# Patient Record
Sex: Male | Born: 1960 | Race: White | Hispanic: No | Marital: Married | Smoking: Current some day smoker
Health system: Southern US, Community
[De-identification: ages and names within clinical notes are randomized; demographics above are authoritative.]

## PROBLEM LIST (undated history)

## (undated) DIAGNOSIS — R220 Localized swelling, mass and lump, head: Secondary | ICD-10-CM

## (undated) DIAGNOSIS — K219 Gastro-esophageal reflux disease without esophagitis: Secondary | ICD-10-CM

## (undated) DIAGNOSIS — R221 Localized swelling, mass and lump, neck: Secondary | ICD-10-CM

## (undated) DIAGNOSIS — I10 Essential (primary) hypertension: Secondary | ICD-10-CM

## (undated) DIAGNOSIS — M1710 Unilateral primary osteoarthritis, unspecified knee: Secondary | ICD-10-CM

## (undated) DIAGNOSIS — E785 Hyperlipidemia, unspecified: Secondary | ICD-10-CM

## (undated) DIAGNOSIS — Z87891 Personal history of nicotine dependence: Secondary | ICD-10-CM

## (undated) DIAGNOSIS — J302 Other seasonal allergic rhinitis: Secondary | ICD-10-CM

## (undated) DIAGNOSIS — J301 Allergic rhinitis due to pollen: Secondary | ICD-10-CM

## (undated) DIAGNOSIS — M25559 Pain in unspecified hip: Secondary | ICD-10-CM

## (undated) HISTORY — DX: Hyperlipidemia, unspecified: E78.5

## (undated) HISTORY — DX: Gastro-esophageal reflux disease without esophagitis: K21.9

## (undated) HISTORY — DX: Unilateral primary osteoarthritis, unspecified knee: M17.10

## (undated) HISTORY — DX: Essential (primary) hypertension: I10

## (undated) HISTORY — DX: Pain in unspecified hip: M25.559

## (undated) HISTORY — DX: Personal history of nicotine dependence: Z87.891

## (undated) HISTORY — DX: Localized swelling, mass and lump, head: R22.0

## (undated) HISTORY — DX: Localized swelling, mass and lump, neck: R22.1

---
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
## 2007-05-26 HISTORY — PX: KNEE SURGERY: SHX244

## 2008-09-12 ENCOUNTER — Encounter: Payer: Self-pay | Admitting: Family Medicine

## 2009-06-26 ENCOUNTER — Encounter: Admission: RE | Admit: 2009-06-26 | Discharge: 2009-07-15 | Payer: Self-pay | Admitting: Orthopedic Surgery

## 2009-07-10 ENCOUNTER — Ambulatory Visit: Payer: Self-pay | Admitting: Family Medicine

## 2009-07-10 DIAGNOSIS — K219 Gastro-esophageal reflux disease without esophagitis: Secondary | ICD-10-CM

## 2009-07-10 DIAGNOSIS — E785 Hyperlipidemia, unspecified: Secondary | ICD-10-CM

## 2009-07-10 DIAGNOSIS — I10 Essential (primary) hypertension: Secondary | ICD-10-CM

## 2009-07-10 DIAGNOSIS — M1710 Unilateral primary osteoarthritis, unspecified knee: Secondary | ICD-10-CM

## 2009-07-10 DIAGNOSIS — Z87891 Personal history of nicotine dependence: Secondary | ICD-10-CM

## 2009-07-10 DIAGNOSIS — IMO0002 Reserved for concepts with insufficient information to code with codable children: Secondary | ICD-10-CM

## 2009-07-10 HISTORY — DX: Gastro-esophageal reflux disease without esophagitis: K21.9

## 2009-07-10 HISTORY — DX: Hyperlipidemia, unspecified: E78.5

## 2009-07-10 HISTORY — DX: Personal history of nicotine dependence: Z87.891

## 2009-07-10 HISTORY — DX: Reserved for concepts with insufficient information to code with codable children: IMO0002

## 2009-07-10 HISTORY — DX: Essential (primary) hypertension: I10

## 2009-09-10 ENCOUNTER — Ambulatory Visit: Payer: Self-pay | Admitting: Family Medicine

## 2009-09-10 DIAGNOSIS — M25559 Pain in unspecified hip: Secondary | ICD-10-CM

## 2009-09-10 HISTORY — DX: Pain in unspecified hip: M25.559

## 2009-09-18 ENCOUNTER — Telehealth: Payer: Self-pay | Admitting: Family Medicine

## 2009-10-17 ENCOUNTER — Ambulatory Visit: Payer: Self-pay | Admitting: Family Medicine

## 2009-11-05 ENCOUNTER — Telehealth: Payer: Self-pay | Admitting: Family Medicine

## 2010-02-13 ENCOUNTER — Ambulatory Visit: Payer: Self-pay | Admitting: Family Medicine

## 2010-05-07 ENCOUNTER — Ambulatory Visit: Payer: Self-pay | Admitting: Family Medicine

## 2010-05-22 ENCOUNTER — Ambulatory Visit: Payer: Self-pay | Admitting: Family Medicine

## 2010-05-22 ENCOUNTER — Encounter: Payer: Self-pay | Admitting: Family Medicine

## 2010-05-29 ENCOUNTER — Ambulatory Visit
Admission: RE | Admit: 2010-05-29 | Discharge: 2010-05-29 | Payer: Self-pay | Source: Home / Self Care | Attending: Family Medicine | Admitting: Family Medicine

## 2010-05-29 ENCOUNTER — Encounter: Payer: Self-pay | Admitting: Family Medicine

## 2010-05-29 ENCOUNTER — Ambulatory Visit: Admit: 2010-05-29 | Payer: Self-pay | Admitting: Family Medicine

## 2010-05-29 DIAGNOSIS — R220 Localized swelling, mass and lump, head: Secondary | ICD-10-CM | POA: Insufficient documentation

## 2010-05-29 DIAGNOSIS — R221 Localized swelling, mass and lump, neck: Secondary | ICD-10-CM

## 2010-05-29 HISTORY — DX: Localized swelling, mass and lump, head: R22.0

## 2010-05-30 ENCOUNTER — Encounter (INDEPENDENT_AMBULATORY_CARE_PROVIDER_SITE_OTHER): Payer: Self-pay | Admitting: *Deleted

## 2010-06-03 ENCOUNTER — Other Ambulatory Visit: Payer: Self-pay | Admitting: Family Medicine

## 2010-06-03 ENCOUNTER — Ambulatory Visit
Admission: RE | Admit: 2010-06-03 | Discharge: 2010-06-03 | Payer: Self-pay | Source: Home / Self Care | Attending: Family Medicine | Admitting: Family Medicine

## 2010-06-03 LAB — BASIC METABOLIC PANEL
BUN: 21 mg/dL (ref 6–23)
CO2: 30 mEq/L (ref 19–32)
Calcium: 9.2 mg/dL (ref 8.4–10.5)
Chloride: 105 mEq/L (ref 96–112)
Creatinine, Ser: 0.6 mg/dL (ref 0.4–1.5)
GFR: 145.74 mL/min (ref >60.00)
Glucose, Bld: 95 mg/dL (ref 70–99)
Potassium: 4.3 mEq/L (ref 3.5–5.1)
Sodium: 141 mEq/L (ref 135–145)

## 2010-06-03 LAB — HEPATIC FUNCTION PANEL
ALT: 33 U/L (ref 0–53)
AST: 23 U/L (ref 0–37)
Albumin: 4.1 g/dL (ref 3.5–5.2)
Alkaline Phosphatase: 73 U/L (ref 39–117)
Bilirubin, Direct: 0.1 mg/dL (ref 0.0–0.3)
Total Bilirubin: 0.8 mg/dL (ref 0.3–1.2)
Total Protein: 7 g/dL (ref 6.0–8.3)

## 2010-06-03 LAB — LIPID PANEL
Cholesterol: 193 mg/dL (ref 0–200)
HDL: 51 mg/dL (ref >39.00)
LDL Cholesterol: 134 mg/dL — ABNORMAL HIGH (ref 0–99)
Total CHOL/HDL Ratio: 4
Triglycerides: 38 mg/dL (ref 0.0–149.0)
VLDL: 7.6 mg/dL (ref 0.0–40.0)

## 2010-06-03 LAB — PSA: PSA: 0.29 ng/mL (ref 0.10–4.00)

## 2010-06-03 LAB — TSH: TSH: 1.49 u[IU]/mL (ref 0.35–5.50)

## 2010-06-10 ENCOUNTER — Ambulatory Visit: Payer: Self-pay | Admitting: Cardiology

## 2010-06-23 ENCOUNTER — Encounter (INDEPENDENT_AMBULATORY_CARE_PROVIDER_SITE_OTHER): Payer: Self-pay

## 2010-06-24 ENCOUNTER — Ambulatory Visit
Admission: RE | Admit: 2010-06-24 | Discharge: 2010-06-24 | Payer: Self-pay | Source: Home / Self Care | Attending: Gastroenterology | Admitting: Gastroenterology

## 2010-06-24 NOTE — Assessment & Plan Note (Signed)
Summary: MED CK // RS   Vital Signs:  Patient profile:   50 year old male Temp:     98.0 degrees F oral BP sitting:   138 / 78  (left arm) Cuff size:   regular  Vitals Entered By: Sid Falcon LPN (Oct 17, 2009 4:23 PM) CC: Med check, knee and hip pain   History of Present Illness: Patient here for the following:  Bilateral hip and knee pains. Early morning stiffness. Pain is moderate. On ibuprofen 800 mg twice daily still has considerable pain at times. Recent x-rays revealed degenerative changes. No injury.  Hypertension treated with lisinopril. Not monitoring at home. No recent headaches, chest pains, dizziness, or palpitations.  Allergies (verified): No Known Drug Allergies  Past History:  Past Medical History: Last updated: 07/10/2009 Arthritis (Osteo) Hyperlipidemia Hypertension Kidney stones  Past Surgical History: Last updated: 07/10/2009 Partial right knee surgery 2009 Eye surgery, born cross eyed) 1965  Review of Systems      See HPI  Physical Exam  General:  Well-developed,well-nourished,in no acute distress; alert,appropriate and cooperative throughout examination Mouth:  Oral mucosa and oropharynx without lesions or exudates.  Teeth in good repair. Neck:  No deformities, masses, or tenderness noted. Lungs:  Normal respiratory effort, chest expands symmetrically. Lungs are clear to auscultation, no crackles or wheezes. Heart:  Normal rate and regular rhythm. S1 and S2 normal without gallop, murmur, click, rub or other extra sounds. Extremities:  full range of motion both knees and fairly good range of motion both hips. No extremity edema. Neurologic:  strength normal in all extremities.     Impression & Recommendations:  Problem # 1:  OSTEOARTHRITIS, KNEES, BILATERAL (ICD-715.96) discussed other options. Try addition of tramadol 50 mg to his ibuprofen. Discussed exercise. His updated medication list for this problem includes:    Flexeril 10 Mg  Tabs (Cyclobenzaprine hcl) ..... Once daily    Ibuprofen 800 Mg Tabs (Ibuprofen) .Marland Kitchen... Three times a day    Tramadol Hcl 50 Mg Tabs (Tramadol hcl) .Marland Kitchen... 1-2 by mouth q 6 hours as needed pain  Problem # 2:  HYPERTENSION (ICD-401.9)  His updated medication list for this problem includes:    Lisinopril 10 Mg Tabs (Lisinopril) ..... Once daily  Complete Medication List: 1)  Flexeril 10 Mg Tabs (Cyclobenzaprine hcl) .... Once daily 2)  Lisinopril 10 Mg Tabs (Lisinopril) .... Once daily 3)  Ibuprofen 800 Mg Tabs (Ibuprofen) .... Three times a day 4)  Tramadol Hcl 50 Mg Tabs (Tramadol hcl) .Marland Kitchen.. 1-2 by mouth q 6 hours as needed pain  Patient Instructions: 1)  It is important that you exercise reguarly at least 20 minutes 5 times a week. If you develop chest pain, have severe difficulty breathing, or feel very tired, stop exercising immediately and seek medical attention.  2)  You need to lose weight. Consider a lower calorie diet and regular exercise.  3)  Please schedule a follow-up appointment in 4 months .  Prescriptions: TRAMADOL HCL 50 MG TABS (TRAMADOL HCL) 1-2 by mouth q 6 hours as needed pain  #120 x 3   Entered and Authorized by:   Evelena Peat MD   Signed by:   Evelena Peat MD on 10/17/2009   Method used:   Electronically to        [facility] (retail)       [address]       [facility] [place], [place]  [address]       Ph: [phone] or [phone]       Fax: [phone]   RxID:   [id number]

## 2010-06-24 NOTE — Letter (Signed)
Summary: Records from [facility] in [place] -2010  Records from [facility] in [place] -2010   Imported By: Maryln Gottron 12/05/2009 12:37:52  _____________________________________________________________________  External Attachment:    Type:   Image     Comment:   External Document

## 2010-06-24 NOTE — Assessment & Plan Note (Signed)
Summary: MUSCLE PAIN // RS   Vital Signs:  Patient profile:   50 year old male Weight:      248 pounds Temp:     98.4 degrees F oral BP sitting:   142 / 98  (left arm) Cuff size:   large  Vitals Entered By: Sid Falcon LPN (September 10, 2009 10:15 AM) CC: muscle and body aches Pain Assessment Patient in pain? yes     Location: hip Intensity: 5 Type: sharp Onset of pain  Intermittent   History of Present Illness: Patient seen with bilateral hip pain. Pain radiates mostly groin region. Duration 3 of 4 months. Pain described as sharp and occasionally achy crampy and intermittent. Exacerbated by hip flexion using ibuprofen without improvement. Vicodin helps. Graded 6/10 at its worst. Pain seems to be slightly worse first thing in the morning. Denies any low back pain does have some right buttock pain intermittently. No incontinence. No numbness or weakness. no injury. Works at [employer] and does lots of lifting.  Allergies: No Known Drug Allergies  Past History:  Past Medical History: Last updated: 07/10/2009 Arthritis (Osteo) Hyperlipidemia Hypertension Kidney stones  Past Surgical History: Last updated: 07/10/2009 Partial right knee surgery 2009 Eye surgery, born cross eyed) [birth year] PMH reviewed for relevance  Review of Systems  The patient denies anorexia, fever, weight loss, and difficulty walking.    Physical Exam  General:  Well-developed,well-nourished,in no acute distress; alert,appropriate and cooperative throughout examination Lungs:  Normal respiratory effort, chest expands symmetrically. Lungs are clear to auscultation, no crackles or wheezes. Heart:  Normal rate and regular rhythm. S1 and S2 normal without gallop, murmur, click, rub or other extra sounds. Extremities:  good range of motion right and left hip. Minimal pain with extreme internal rotation. No edema lower extremities. Normal pulses. Straight leg raise is negative  bilaterally Neurologic:  full strength lower extremities with plantar flexion, dorsiflexion, and knee extension. Normal sensory function   Impression & Recommendations:  Problem # 1:  HIP PAIN, BILATERAL (ICD-719.45)  ?iliopsoas tendonitis, ?osteoarthritis.  given duration obtained plain films to further assess. Continue ibuprofen. His updated medication list for this problem includes:    Flexeril 10 Mg Tabs (Cyclobenzaprine hcl) ..... Once daily    Ibuprofen 800 Mg Tabs (Ibuprofen) .Marland Kitchen... Three times a day  Orders: T-Hip Comp Left Min 2-views (73510TC) T-Hip Comp Right Min 2 views (73510TC)  Complete Medication List: 1)  Flexeril 10 Mg Tabs (Cyclobenzaprine hcl) .... Once daily 2)  Lisinopril 10 Mg Tabs (Lisinopril) .... Once daily 3)  Ibuprofen 800 Mg Tabs (Ibuprofen) .... Three times a day  Patient Instructions: 1)  Obtain hip x-rays. 2)  Will call with results. 3)  Consider physical therapy if x-rays unrevealing.

## 2010-06-24 NOTE — Assessment & Plan Note (Signed)
Summary: 4 month rov/njr   Vital Signs:  Patient profile:   50 year old male Weight:      231 pounds Temp:     98.1 degrees F oral BP sitting:   138 / 100  (left arm) Cuff size:   regular  Vitals Entered By: Sid Falcon LPN (February 13, 2010 4:30 PM)  Serial Vital Signs/Assessments:  Time      Position  BP       Pulse  Resp  Temp     By                     146/94                         Evelena Peat MD  CC: 4 month follow-up, Hypertension Management   History of Present Illness: Patient here for medical followup. Hypertension treated with lisinopril 10 mg daily. Blood pressure control marginal.  Knee pain improved with tramadol.  Hypertension History:      He denies headache, chest pain, palpitations, dyspnea with exertion, orthopnea, PND, peripheral edema, visual symptoms, neurologic problems, syncope, and side effects from treatment.  He notes no problems with any antihypertensive medication side effects.        Positive major cardiovascular risk factors include male age 21 years old or older, hyperlipidemia, hypertension, and current tobacco user.     Allergies (verified): No Known Drug Allergies  Past History:  Past Medical History: Last updated: 07/10/2009 Arthritis (Osteo) Hyperlipidemia Hypertension Kidney stones PMH reviewed for relevance  Review of Systems  The patient denies chest pain, syncope, dyspnea on exertion, peripheral edema, headaches, and abdominal pain.    Physical Exam  General:  Well-developed,well-nourished,in no acute distress; alert,appropriate and cooperative throughout examination Mouth:  Oral mucosa and oropharynx without lesions or exudates.  Teeth in good repair. Neck:  No deformities, masses, or tenderness noted. Lungs:  Normal respiratory effort, chest expands symmetrically. Lungs are clear to auscultation, no crackles or wheezes. Heart:  Normal rate and regular rhythm. S1 and S2 normal without gallop, murmur, click, rub or  other extra sounds. Extremities:  No clubbing, cyanosis, edema, or deformity noted with normal full range of motion of all joints.     Impression & Recommendations:  Problem # 1:  HYPERTENSION (ICD-401.9) Assessment Deteriorated titrate lisinopril 20 mg daily His updated medication list for this problem includes:    Lisinopril 20 Mg Tabs (Lisinopril) ..... One by mouth once daily  Problem # 2:  OSTEOARTHRITIS, KNEES, BILATERAL (ICD-715.96) Assessment: Improved  His updated medication list for this problem includes:    Flexeril 10 Mg Tabs (Cyclobenzaprine hcl) ..... Once daily as needed    Ibuprofen 800 Mg Tabs (Ibuprofen) .Marland Kitchen... Three times a day    Tramadol Hcl 50 Mg Tabs (Tramadol hcl) .Marland Kitchen... 1-2 by mouth q 6 hours as needed pain  Problem # 3:  Preventive Health Care (ICD-V70.0) flu vaccine given  Complete Medication List: 1)  Flexeril 10 Mg Tabs (Cyclobenzaprine hcl) .... Once daily as needed 2)  Lisinopril 20 Mg Tabs (Lisinopril) .... One by mouth once daily 3)  Ibuprofen 800 Mg Tabs (Ibuprofen) .... Three times a day 4)  Tramadol Hcl 50 Mg Tabs (Tramadol hcl) .Marland Kitchen.. 1-2 by mouth q 6 hours as needed pain  Other Orders: Admin 1st Vaccine (60454) Flu Vaccine 11yrs + (09811)  Hypertension Assessment/Plan:      The patient's hypertensive risk group is category B: At  least one risk factor (excluding diabetes) with no target organ damage.  Today's blood pressure is 138/100.    Patient Instructions: 1)  Check your  Blood Pressure regularly . If it is above:140/90   you should make an appointment. 2)  increase lisinopril 20 mg daily 3)  Please schedule a follow-up appointment in 3 months .  Prescriptions: LISINOPRIL 20 MG TABS (LISINOPRIL) one by mouth once daily  #90 x 3   Entered and Authorized by:   Evelena Peat MD   Signed by:   Evelena Peat MD on 02/13/2010   Method used:   Electronically to        [facility] (retail)       [address].       [place], [place]  [address]       Ph: [phone] or [phone]       Fax: [phone]   RxID:   [id number]   Flu Vaccine Consent Questions     Do you have a history of severe allergic reactions to this vaccine? no    Any prior history of allergic reactions to egg and/or gelatin? no    Do you have a sensitivity to the preservative Thimersol? no    Do you have a past history of Guillan-Barre Syndrome? no    Do you currently have an acute febrile illness? no    Have you ever had a severe reaction to latex? no    Vaccine information given and explained to patient? yes    Are you currently pregnant? no    Lot Number:[id number]   Exp Date:11/22/2010   Site Given  Left Deltoid IMbflu

## 2010-06-24 NOTE — Assessment & Plan Note (Signed)
Summary: NEW PT OV//CCM   Vital Signs:  Patient profile:   50 year old male Height:      66. inches Weight:      240 pounds BMI:     38.88 Temp:     99.2 degrees F oral Pulse rate:   80 / minute Pulse rhythm:   regular Resp:     12 per minute BP sitting:   140 / 90  (left arm) Cuff size:   regular  Vitals Entered By: Sid Falcon LPN (July 10, 2009 10:57 AM)  Nutrition Counseling: Patient's BMI is greater than 25 and therefore counseled on weight management options. CC: New to establish, new to area from [place], Hypertension Management   History of Present Illness:  new patient to establish care.  past medical history reviewed. He has history of osteoarthritis with previous right partial knee replacement 2009. Hypertension treated with lisinopril 10 mg daily. History of mild elevated lipids but never treated. Prior history of one solitary kidney stone.  Patient takes over-the-counter ibuprofen intermittently. Lisinopril 10 mg daily. Flexeril 10 mg as needed.  occasional GERD symptoms usually relieved with over-the-counter medications Family history significant for father hypertension, mother with history breast cancer.  Patient has couple children from previous marriage and couple step children. Smokes one pack cigarettes per day. Occasional alcohol use. Works as a Doctor, hospital. No regular exercise   Hypertension History:      He denies headache, chest pain, palpitations, dyspnea with exertion, orthopnea, PND, peripheral edema, visual symptoms, neurologic problems, syncope, and side effects from treatment.        Positive major cardiovascular risk factors include male age 51 years old or older, hyperlipidemia, hypertension, and current tobacco user.     Preventive Screening-Counseling & Management  Alcohol-Tobacco     Smoking Status: current     Packs/Day: 1.0     Year Started: 1977  Allergies (verified): No Known Drug Allergies  Past History:  Social  History: Last updated: 07/10/2009 Occupation:  Postal service Married Current Smoker, one PPD Alcohol use-yes  Risk Factors: Smoking Status: current (07/10/2009) Packs/Day: 1.0 (07/10/2009)  Past Medical History: Arthritis (Osteo) Hyperlipidemia Hypertension Kidney stones  Past Surgical History: Partial right knee surgery 2009 Eye surgery, born cross eyed) 59  Family History: Family History Hypertension  Social History: Occupation:  Research officer, political party Married Current Smoker, one PPD Alcohol use-yes Smoking Status:  current Packs/Day:  1.0 Occupation:  employed  Review of Systems       The patient complains of weight gain.  The patient denies anorexia, fever, weight loss, vision loss, decreased hearing, hoarseness, chest pain, syncope, dyspnea on exertion, peripheral edema, prolonged cough, headaches, hemoptysis, abdominal pain, melena, hematochezia, severe indigestion/heartburn, hematuria, incontinence, and muscle weakness.    Physical Exam  General:  patient is alert pleasant obese in no distress Head:  Normocephalic and atraumatic without obvious abnormalities. No apparent alopecia or balding. Eyes:  No corneal or conjunctival inflammation noted. EOMI. Perrla. Funduscopic exam benign, without hemorrhages, exudates or papilledema. Vision grossly normal. Mouth:  Oral mucosa and oropharynx without lesions or exudates.  Teeth in good repair. Neck:  No deformities, masses, or tenderness noted. Lungs:  Normal respiratory effort, chest expands symmetrically. Lungs are clear to auscultation, no crackles or wheezes. Heart:  normal rate, regular rhythm, and no murmur.   Extremities:  no pitting edema Neurologic:  alert & oriented X3, cranial nerves II-XII intact, and strength normal in all extremities.   Psych:  normally interactive, good eye  contact, not anxious appearing, and not depressed appearing.     Impression & Recommendations:  Problem # 1:  HYPERTENSION  (ICD-401.9) borderline control.  Work on weight loss and f/u within 6 months. His updated medication list for this problem includes:    Lisinopril 10 Mg Tabs (Lisinopril) ..... Once daily  Problem # 2:  HYPERLIPIDEMIA (ICD-272.4)  Problem # 3:  GERD (ICD-530.81)  Problem # 4:  OSTEOARTHRITIS, KNEES, BILATERAL (ICD-715.96)  His updated medication list for this problem includes:    Flexeril 10 Mg Tabs (Cyclobenzaprine hcl) ..... Once daily    Ibuprofen 800 Mg Tabs (Ibuprofen) .Marland Kitchen... Three times a day  Problem # 5:  PERS HX TOBACCO USE PRESENTING HAZARDS HEALTH (ICD-V15.82) discussed smoking cessation.  Complete Medication List: 1)  Flexeril 10 Mg Tabs (Cyclobenzaprine hcl) .... Once daily 2)  Lisinopril 10 Mg Tabs (Lisinopril) .... Once daily 3)  Ibuprofen 800 Mg Tabs (Ibuprofen) .... Three times a day  Hypertension Assessment/Plan:      The patient's hypertensive risk group is category B: At least one risk factor (excluding diabetes) with no target organ damage.  Today's blood pressure is 140/90.    Patient Instructions: 1)  Consider scheduling complete physical examination by next fall 2)  Stop smoking tips: Choose a quit date. Cut down before the quit date. Decide what you will do as a substitute when you feel the urge to smoke(gum, toothpick, exercise).  3)  It is important that you exercise reguarly at least 20 minutes 5 times a week. If you develop chest pain, have severe difficulty breathing, or feel very tired, stop exercising immediately and seek medical attention.  4)  You need to lose weight. Consider a lower calorie diet and regular exercise.   Preventive Care Screening  Last Tetanus Booster:    Date:  05/26/2007    Results:  Historical

## 2010-06-24 NOTE — Progress Notes (Signed)
Summary: Tramadol working  Phone Note Call from Patient   Caller: Patient Call For: Evelena Peat MD Summary of Call: Pt wants Dr. Caryl Never to know that the Tramadol is working great. [phone] Initial call taken by: Lynann Beaver CMA,  November 05, 2009 3:23 PM  Follow-up for Phone Call        noted Follow-up by: Evelena Peat MD,  November 05, 2009 6:55 PM

## 2010-06-24 NOTE — Progress Notes (Signed)
Summary: Pt returning call from Dr. Caryl Never  Phone Note Call from Patient Call back at Home Phone [phone]   Caller: Patient Summary of Call: Pt said that he is returning Dr. Mar Daring call. Please call back asap.  Initial call taken by: Lucy Antigua,  September 18, 2009 8:25 AM  Follow-up for Phone Call        pt notified of x-ray results.  Moderate degenerative changes hips.  At this point he does not wish to see orthopedist.  Consider PT and patient will get back if interested. Follow-up by: Evelena Peat MD,  September 18, 2009 1:31 PM

## 2010-06-26 LAB — CONVERTED CEMR LAB
Bilirubin Urine: NEGATIVE
Blood in Urine, dipstick: NEGATIVE
Glucose, Urine, Semiquant: NEGATIVE
Ketones, urine, test strip: NEGATIVE
Nitrite: NEGATIVE
Protein, U semiquant: NEGATIVE
Specific Gravity, Urine: 1.025
Urobilinogen, UA: 0.2
WBC Urine, dipstick: NEGATIVE
pH: 5

## 2010-06-26 NOTE — Letter (Signed)
Summary: Out of Work  Adult nurse at [facility]  [address]   [place], [place] [address]   Phone: [phone]  Fax: [phone]    May 29, 2010   Employee:  SAED HUDLOW    To Whom It May Concern:   For Medical reasons, please excuse the above named employee from work for the following dates:  Start:   05/29/2010  End:   05/29/2010  If you need additional information, please feel free to contact our office.         Sincerely,         Evelena Peat, MD

## 2010-06-26 NOTE — Letter (Signed)
Summary: Pre Visit Letter Revised  [facility]  [address] [place], [place] [address]   Phone: [phone]  Fax: [phone]        05/30/2010 MRN: [id number] [facility] [address] [place], [place]  [address]             Procedure Date:  07-08-10   Welcome to the Gastroenterology Division at [facility].    You are scheduled to see a nurse for your pre-procedure visit on 06-24-10 at 1:00p.m. on the 3rd floor at [facility], [address]  We ask that you try to arrive at our office 15 minutes prior to your appointment time to allow for check-in.  Please take a minute to review the attached form.  If you answer "Yes" to one or more of the questions on the first page, we ask that you call the person listed at your earliest opportunity.  If you answer "No" to all of the questions, please complete the rest of the form and bring it to your appointment.    Your nurse visit will consist of discussing your medical and surgical history, your immediate family medical history, and your medications.   If you are unable to list all of your medications on the form, please bring the medication bottles to your appointment and we will list them.  We will need to be aware of both prescribed and over the counter drugs.  We will need to know exact dosage information as well.    Please be prepared to read and sign documents such as consent forms, a financial agreement, and acknowledgement forms.  If necessary, and with your consent, a friend or relative is welcome to sit-in on the nurse visit with you.  Please bring your insurance card so that we may make a copy of it.  If your insurance requires a referral to see a specialist, please bring your referral form from your primary care physician.  No co-pay is required for this nurse visit.     If you cannot keep your appointment, please call [phone] to cancel or reschedule prior to your appointment date.  This  allows [place] the opportunity to schedule an appointment for another patient in need of care.    Thank you for choosing [facility] for your medical needs.  We appreciate the opportunity to care for you.  Please visit [place] at our website  to learn more about our practice.  Sincerely, The Gastroenterology Division

## 2010-06-26 NOTE — Letter (Signed)
Summary: Out of Work  [facility] at [facility]  [address]   [place], [place] [address]   Phone: [phone]  Fax: [phone]    May 22, 2010   Employee:  Luis Ryan    To Whom It May Concern:   For Medical reasons, please excuse the above named employee from work for the following dates:  Start:   05/22/2010  End:   05/22/2010  If you need additional information, please feel free to contact our office.         Sincerely,       Evelena Peat, MD

## 2010-06-26 NOTE — Assessment & Plan Note (Signed)
Summary: cpx//ccm   Vital Signs:  Patient profile:   50 year old male Height:      66 inches Weight:      225 pounds BMI:     36.45 Temp:     98.7 degrees F oral Pulse rate:   12 / minute Pulse rhythm:   regular Resp:     12 per minute BP sitting:   120 / 82  (left arm) Cuff size:   large  Vitals Entered By: Sid Falcon LPN (May 29, 2010 10:42 AM)  History of Present Illness: Here for CPE.  Trying to quit smoking. Has lost some weight due to his efforts.  No hx colonoscopy.  Labs drawn but only CBC came back.  Per insurance, they were sent to [facility].  Pt has noted for about one year some R sided neck swelling which is a new problem.  No dysphagia.  Good appetite.  Swelling does not fluctuate over time.  No sore throat.  hx osteoarthritis, esp knees.  Early AM stiffness.  Tramadol helps and needs refills.  Hypertension History:      He denies headache, chest pain, palpitations, dyspnea with exertion, orthopnea, PND, peripheral edema, visual symptoms, neurologic problems, syncope, and side effects from treatment.        Positive major cardiovascular risk factors include male age 26 years old or older, hyperlipidemia, hypertension, and current tobacco user.     Clinical Review Panels:  Immunizations   Last Tetanus Booster:  Historical (05/26/2007)   Last Flu Vaccine:  Fluvax 3+ (02/13/2010)   Allergies (verified): No Known Drug Allergies  Past History:  Past Medical History: Last updated: 07/10/2009 Arthritis (Osteo) Hyperlipidemia Hypertension Kidney stones  Past Surgical History: Last updated: 07/10/2009 Partial right knee surgery 2009 Eye surgery, born cross eyed) 1965  Family History: Last updated: 07/10/2009 Family History Hypertension  Social History: Last updated: 07/10/2009 Occupation:  Postal service Married Current Smoker, one PPD Alcohol use-yes  Risk Factors: Smoking Status: current (07/10/2009) Packs/Day: 1.0  (07/10/2009) PMH-FH-SH reviewed for relevance  Review of Systems  The patient denies anorexia, fever, weight loss, weight gain, vision loss, decreased hearing, hoarseness, chest pain, syncope, dyspnea on exertion, peripheral edema, prolonged cough, headaches, hemoptysis, abdominal pain, melena, hematochezia, severe indigestion/heartburn, hematuria, incontinence, genital sores, muscle weakness, suspicious skin lesions, transient blindness, difficulty walking, depression, unusual weight change, abnormal bleeding, enlarged lymph nodes, and testicular masses.    Physical Exam  General:  Well-developed,well-nourished,in no acute distress; alert,appropriate and cooperative throughout examination Head:  Normocephalic and atraumatic without obvious abnormalities. No apparent alopecia or balding. Eyes:  No corneal or conjunctival inflammation noted. EOMI. Perrla. Funduscopic exam benign, without hemorrhages, exudates or papilledema. Vision grossly normal. Ears:  External ear exam shows no significant lesions or deformities.  Otoscopic examination reveals clear canals, tympanic membranes are intact bilaterally without bulging, retraction, inflammation or discharge. Hearing is grossly normal bilaterally. Mouth:  Oral mucosa and oropharynx without lesions or exudates.  Teeth in good repair. Neck:  pt has ?fatty mass several cms diameter below R mandible and also some swelling and ?enlargement R  submandibular region.  No adenopathy. Lungs:  Normal respiratory effort, chest expands symmetrically. Lungs are clear to auscultation, no crackles or wheezes. Heart:  Normal rate and regular rhythm. S1 and S2 normal without gallop, murmur, click, rub or other extra sounds. Abdomen:  Bowel sounds positive,abdomen soft and non-tender without masses, organomegaly or hernias noted. Rectal:  No external abnormalities noted. Normal sphincter tone. No rectal  masses or tenderness. Prostate:  Prostate gland firm and smooth, no  enlargement, nodularity, tenderness, mass, asymmetry or induration. Msk:  No deformity or scoliosis noted of thoracic or lumbar spine.   Extremities:  No clubbing, cyanosis, edema, or deformity noted with normal full range of motion of all joints.   Neurologic:  No cranial nerve deficits noted. Station and gait are normal. Plantar reflexes are down-going bilaterally. DTRs are symmetrical throughout. Sensory, motor and coordinative functions appear intact. Skin:  no rashes and no suspicious lesions.   Cervical Nodes:  No lymphadenopathy noted Psych:  normally interactive, good eye contact, not anxious appearing, and not depressed appearing.     Impression & Recommendations:  Problem # 1:  Preventive Health Care (ICD-V70.0) needs to quit smoking.  We are looking at redrawing his labs. Needs to lose some weight.  Baseline EKG looks OK. Set up colonoscopy.  Problem # 2:  NECK MASS (ICD-784.2) Assessment: New ?lipoma.  this per pt is growing some over the past year.  CT soft tissue neck to furhter assess. Orders: Radiology Referral (Radiology)  Problem # 3:  OSTEOARTHRITIS, KNEES, BILATERAL (ICD-715.96)  His updated medication list for this problem includes:    Flexeril 10 Mg Tabs (Cyclobenzaprine hcl) ..... Once daily as needed    Ibuprofen 800 Mg Tabs (Ibuprofen) .Marland Kitchen... Three times a day    Tramadol Hcl 50 Mg Tabs (Tramadol hcl) .Marland Kitchen... 1-2 by mouth q 6 hours as needed pain  Problem # 4:  HYPERTENSION (ICD-401.9)  His updated medication list for this problem includes:    Lisinopril-hydrochlorothiazide 20-12.5 Mg Tabs (Lisinopril-hydrochlorothiazide) ..... One by mouth once daily  Problem # 5:  HYPERLIPIDEMIA (ICD-272.4)  Complete Medication List: 1)  Flexeril 10 Mg Tabs (Cyclobenzaprine hcl) .... Once daily as needed 2)  Lisinopril-hydrochlorothiazide 20-12.5 Mg Tabs (Lisinopril-hydrochlorothiazide) .... One by mouth once daily 3)  Ibuprofen 800 Mg Tabs (Ibuprofen) .... Three times a  day 4)  Tramadol Hcl 50 Mg Tabs (Tramadol hcl) .Marland Kitchen.. 1-2 by mouth q 6 hours as needed pain  Other Orders: EKG w/ Interpretation (93000) Gastroenterology Referral (GI)  Hypertension Assessment/Plan:      The patient's hypertensive risk group is category B: At least one risk factor (excluding diabetes) with no target organ damage.  Today's blood pressure is 120/82.    Patient Instructions: 1)  Stop smoking tips: Choose a quit date. Cut down before the quit date. Decide what you will do as a substitute when you feel the urge to smoke(gum, toothpick, exercise).  2)  It is important that you exercise reguarly at least 20 minutes 5 times a week. If you develop chest pain, have severe difficulty breathing, or feel very tired, stop exercising immediately and seek medical attention.  3)  You need to lose weight. Consider a lower calorie diet and regular exercise.  4)  We will call you regarding colonoscopy and CT scan of neck appointments. Prescriptions: TRAMADOL HCL 50 MG TABS (TRAMADOL HCL) 1-2 by mouth q 6 hours as needed pain  #120 x 3   Entered and Authorized by:   Evelena Peat MD   Signed by:   Evelena Peat MD on 05/29/2010   Method used:   Electronically to        [facility] (retail)       [address]       [place], [place]  [address]       Ph: [phone]  or [phone]       Fax: [phone]   RxID:   [id number]    Orders Added: 1)  Est. Patient 40-64 years [99396] 2)  EKG w/ Interpretation [93000] 3)  Gastroenterology Referral [GI] 4)  Radiology Referral [Radiology] 5)  Est. Patient Level IV [62952]

## 2010-06-26 NOTE — Assessment & Plan Note (Signed)
Summary: right leg edema/cjr   Vital Signs:  Patient profile:   50 year old male Weight:      231 pounds Temp:     98.7 degrees F oral BP sitting:   160 / 92  (left arm) Cuff size:   large  Vitals Entered By: Sid Falcon LPN (May 07, 2010 2:25 PM)  Serial Vital Signs/Assessments:  Time      Position  BP       Pulse  Resp  Temp     By                     148/88                         Evelena Peat MD   History of Present Illness: Patient seen with some right knee and leg mild edema past week. 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 No recent injury. Has noticed some edema around the knee. No warmth or erythema. No locking or giving way. Takes ibuprofen and tramadol which helps slightly. Has noticed some swelling posterior aspect of knee.  History hypertension treated lisinopril 20 mg daily. Has recently had some elevated readings. No headache.  Systolics fairly consistently above 140 range.  Hypertension History:      He denies headache, chest pain, palpitations, dyspnea with exertion, orthopnea, PND, visual symptoms, neurologic problems, syncope, and side effects from treatment.  He notes no problems with any antihypertensive medication side effects.        Positive major cardiovascular risk factors include male age 46 years old or older, hyperlipidemia, hypertension, and current tobacco user.     Allergies (verified): No Known Drug Allergies  Past History:  Past Medical History: Last updated: 07/10/2009 Arthritis (Osteo) Hyperlipidemia Hypertension Kidney stones  Past Surgical History: Last updated: 07/10/2009 Partial right knee surgery 2009 Eye surgery, born cross eyed) 1965  Family History: Last updated: 07/10/2009 Family History Hypertension  Social History: Last updated: 07/10/2009 Occupation:  Postal service Married Current Smoker, one PPD Alcohol use-yes  Risk Factors: Smoking Status: current (07/10/2009) Packs/Day: 1.0  (07/10/2009)  Review of Systems       The patient complains of peripheral edema.  The patient denies anorexia, fever, chest pain, syncope, dyspnea on exertion, prolonged cough, headaches, abdominal pain, muscle weakness, and difficulty walking.    Physical Exam  General:  Well-developed,well-nourished,in no acute distress; alert,appropriate and cooperative throughout examination Neck:  No deformities, masses, or tenderness noted. Lungs:  Normal respiratory effort, chest expands symmetrically. Lungs are clear to auscultation, no crackles or wheezes. Heart:  normal rate and regular rhythm.   Extremities:  patient has vertical scar right knee from prior surgery. Full range of motion. No warmth or erythema. Possible very small effusion right knee. Very minimal popliteal swelling on the right-?small Baker's cyst. No ecchymosis. Minimal medial joint line tenderness. Anterior drawer test is normal. No ligament instability. Neurologic:  alert & oriented X3 and cranial nerves II-XII intact.   Skin:  no rashes, no suspicious lesions, and no ecchymoses.   Psych:  normally interactive, good eye contact, not anxious appearing, and not depressed appearing.     Impression & Recommendations:  Problem # 1:  HYPERTENSION (ICD-401.9) Assessment Deteriorated change meds to lisonopril HCTZ. His updated medication list for this problem includes:    Lisinopril-hydrochlorothiazide 20-12.5 Mg Tabs (Lisinopril-hydrochlorothiazide) ..... One by mouth once daily  Problem # 2:  OSTEOARTHRITIS, KNEES, BILATERAL (ICD-715.96) Assessment: Deteriorated no  evidence clinically to suggest DVT. Small right knee effusion. Continued ibuprofen with caution against long-term use. Orthopedic follow up if persists.  No injections with hx of replacement. His updated medication list for this problem includes:    Flexeril 10 Mg Tabs (Cyclobenzaprine hcl) ..... Once daily as needed    Ibuprofen 800 Mg Tabs (Ibuprofen) .Marland Kitchen... Three  times a day    Tramadol Hcl 50 Mg Tabs (Tramadol hcl) .Marland Kitchen... 1-2 by mouth q 6 hours as needed pain  Complete Medication List: 1)  Flexeril 10 Mg Tabs (Cyclobenzaprine hcl) .... Once daily as needed 2)  Lisinopril-hydrochlorothiazide 20-12.5 Mg Tabs (Lisinopril-hydrochlorothiazide) .... One by mouth once daily 3)  Ibuprofen 800 Mg Tabs (Ibuprofen) .... Three times a day 4)  Tramadol Hcl 50 Mg Tabs (Tramadol hcl) .Marland Kitchen.. 1-2 by mouth q 6 hours as needed pain  Hypertension Assessment/Plan:      The patient's hypertensive risk group is category B: At least one risk factor (excluding diabetes) with no target organ damage.  Today's blood pressure is 160/92.    Patient Instructions: 1)  Please schedule a follow-up appointment in 3 months .  2)  Be in touch if knee pain and swelling worsening or persisting over the next couple of weeks. Prescriptions: LISINOPRIL-HYDROCHLOROTHIAZIDE 20-12.5 MG TABS (LISINOPRIL-HYDROCHLOROTHIAZIDE) one by mouth once daily  #30 x 11   Entered and Authorized by:   Evelena Peat MD   Signed by:   Evelena Peat MD on 05/07/2010   Method used:   Electronically to        [facility] (retail)       [address]       [place], [place]  [address]       Ph: [phone] or [phone]       Fax: [phone]   RxID:   [id number]    Orders Added: 1)  Est. Patient Level IV [33295]

## 2010-07-02 NOTE — Miscellaneous (Signed)
Summary: Lec previsit  Clinical Lists Changes  Medications: Added new medication of MOVIPREP 100 GM  SOLR (PEG-KCL-NACL-NASULF-NA ASC-C) As per prep instructions. - Signed Rx of MOVIPREP 100 GM  SOLR (PEG-KCL-NACL-NASULF-NA ASC-C) As per prep instructions.;  #1 x 0;  Signed;  Entered by: Ulis Rias RN;  Authorized by: Rachael Fee MD;  Method used: Electronically to [facility] [address], [place], [place], [place]  [address], Ph: [phone] or [phone], Fax: [phone] Observations: Added new observation of NKA: T (06/24/2010 12:48)    Prescriptions: MOVIPREP 100 GM  SOLR (PEG-KCL-NACL-NASULF-NA ASC-C) As per prep instructions.  #1 x 0   Entered by:   Ulis Rias RN   Authorized by:   Rachael Fee MD   Signed by:   Ulis Rias RN on 06/24/2010   Method used:   Electronically to        [facility] (retail)       [address]       [place], [place]  [address]       Ph: [phone] or [phone]       Fax: [phone]   RxID:   [id number]

## 2010-07-02 NOTE — Letter (Signed)
Summary: [facility] Instructions  [facility]  [address] [place], [place] [address]   Phone: [phone]  Fax: [phone]       Luis Ryan    09/18/1960    MRN: [id number]        Procedure Day /Date: Tuesday 07-08-10     Arrival Time: 8:30 am     Procedure Time: 9:30 am     Location of Procedure:                    _x _  [facility] (4th Floor)                        PREPARATION FOR COLONOSCOPY WITH MOVIPREP   Starting 5 days prior to your procedure  07-03-10 do not eat nuts, seeds, popcorn, corn, beans, peas,  salads, or any raw vegetables.  Do not take any fiber supplements (e.g. Metamucil, Citrucel, and Benefiber).  THE DAY BEFORE YOUR PROCEDURE         DATE:  07-07-10   DAY: Monday   1.  Drink clear liquids the entire day-NO SOLID FOOD  2.  Do not drink anything colored red or purple.  Avoid juices with pulp.  No orange juice.  3.  Drink at least 64 oz. (8 glasses) of fluid/clear liquids during the day to prevent dehydration and help the prep work efficiently.  CLEAR LIQUIDS INCLUDE: Water Jello Ice Popsicles Tea (sugar ok, no milk/cream) Powdered fruit flavored drinks Coffee (sugar ok, no milk/cream) Gatorade Juice: apple, white grape, white cranberry  Lemonade Clear bullion, consomm, broth Carbonated beverages (any kind) Strained chicken noodle soup Hard Candy                             4.  In the morning, mix first dose of MoviPrep solution:    Empty 1 Pouch A and 1 Pouch B into the disposable container    Add lukewarm drinking water to the top line of the container. Mix to dissolve    Refrigerate (mixed solution should be used within 24 hrs)  5.  Begin drinking the prep at 5:00 p.m. The MoviPrep container is divided by 4 marks.   Every 15 minutes drink the solution down to the next mark (approximately 8 oz) until the full liter is complete.   6.  Follow completed prep with 16 oz of clear liquid of your choice  (Nothing red or purple).  Continue to drink clear liquids until bedtime.  7.  Before going to bed, mix second dose of MoviPrep solution:    Empty 1 Pouch A and 1 Pouch B into the disposable container    Add lukewarm drinking water to the top line of the container. Mix to dissolve    Refrigerate  THE DAY OF YOUR PROCEDURE      DATE:  07-08-10  DAY: Tuesday  Beginning at  4:30 a.m. (5 hours before procedure):         1. Every 15 minutes, drink the solution down to the next mark (approx 8 oz) until the full liter is complete.  2. Follow completed prep with 16 oz. of clear liquid of your choice.    3. You may drink clear liquids until  7:30 a.m. (2 HOURS BEFORE PROCEDURE).   MEDICATION INSTRUCTIONS  Unless otherwise instructed, you should take regular prescription medications with a small sip of water  as early as possible the morning of your procedure.  Additional medication instructions: Do not take Lisinopril/HCTZ am of procedure.         OTHER INSTRUCTIONS  You will need a responsible adult at least 51 years of age to accompany you and drive you home.   This person must remain in the waiting room during your procedure.  Wear loose fitting clothing that is easily removed.  Leave jewelry and other valuables at home.  However, you may wish to bring a book to read or  an iPod/MP3 player to listen to music as you wait for your procedure to start.  Remove all body piercing jewelry and leave at home.  Total time from sign-in until discharge is approximately 2-3 hours.  You should go home directly after your procedure and rest.  You can resume normal activities the  day after your procedure.  The day of your procedure you should not:   Drive   Make legal decisions   Operate machinery   Drink alcohol   Return to work  You will receive specific instructions about eating, activities and medications before you leave.    The above instructions have been reviewed  and explained to me by   Ulis Rias RN  June 24, 2010 1:17 PM     I fully understand and can verbalize these instructions _____________________________ Date _________

## 2010-07-07 LAB — HM COLONOSCOPY

## 2010-07-08 ENCOUNTER — Other Ambulatory Visit (AMBULATORY_SURGERY_CENTER): Payer: PRIVATE HEALTH INSURANCE | Admitting: Gastroenterology

## 2010-07-08 ENCOUNTER — Other Ambulatory Visit: Payer: Self-pay | Admitting: Gastroenterology

## 2010-07-08 DIAGNOSIS — D126 Benign neoplasm of colon, unspecified: Secondary | ICD-10-CM

## 2010-07-08 DIAGNOSIS — Z1211 Encounter for screening for malignant neoplasm of colon: Secondary | ICD-10-CM

## 2010-07-15 ENCOUNTER — Encounter: Payer: Self-pay | Admitting: Gastroenterology

## 2010-07-16 NOTE — Procedures (Addendum)
Summary: Colonoscopy  Patient: Luis Ryan Note: All result statuses are Final unless otherwise noted.  Tests: (1) Colonoscopy (COL)   COL Colonoscopy           DONE     [facility]     [address]     [place], [place]  [address]           COLONOSCOPY PROCEDURE REPORT           PATIENT:  Luis Ryan, Luis Ryan  MR#:  [id number]     BIRTHDATE:  [birth year], 49 yrs. old  GENDER:  male     ENDOSCOPIST:  Rachael Fee, MD     REF. BY:  Evelena Peat, M.D.     PROCEDURE DATE:  07/08/2010     PROCEDURE:  Colonoscopy with snare polypectomy     ASA CLASS:  Class II     INDICATIONS:  Routine Risk Screening     MEDICATIONS:  Fentanyl 75 mcg IV, Versed 7 mg IV           DESCRIPTION OF PROCEDURE:   After the risks benefits and     alternatives of the procedure were thoroughly explained, informed     consent was obtained.  Digital rectal exam was performed and     revealed no rectal masses.   The LB 180AL [id number] endoscope was     introduced through the anus and advanced to the cecum, which was     identified by both the appendix and ileocecal valve, without     limitations.  The quality of the prep was good, using MoviPrep.     The instrument was then slowly withdrawn as the colon was fully     examined.     <<PROCEDUREIMAGES>>     FINDINGS:  Three polyps were found, all were removed with cold     snare and all sent to pathology (jar 1). These ranged in size from     3mm to 8mm, were located in rectum, descending and ascending colon     segments (see image3 and image4).  This was otherwise a normal     examination of the colon (see image1, image2, and image5).     Retroflexed views in the rectum revealed no abnormalities.    The     scope was then withdrawn from the patient and the procedure     completed.     COMPLICATIONS:  None           ENDOSCOPIC IMPRESSION:     1) Three subcentimeter polyps, all removed and all sent to     pathology     2) Otherwise normal  examination           RECOMMENDATIONS:     1) If the polyps removed today are proven to be adenomatous     (pre-cancerous) polyps, you will need a colonoscopy in 3-5 years.     Otherwise you should continue to follow colorectal cancer     screening guidelines for "routine risk" patients with a     colonoscopy in 10 years.     2) You will receive a letter within 1-2 weeks with the results     of your biopsy as well as final recommendations. Please call my     office if you have not received a letter after 3 weeks.           ______________________________     Rachael Fee, MD  n.     eSIGNED:   Rachael Fee at 07/08/2010 09:50 AM           Mordecai Rasmussen, [id number]  Note: An exclamation mark (!) indicates a result that was not dispersed into the flowsheet. Document Creation Date: 07/08/2010 9:50 AM _______________________________________________________________________  (1) Order result status: Final Collection or observation date-time: 07/08/2010 09:44 Requested date-time:  Receipt date-time:  Reported date-time:  Referring Physician:   Ordering Physician: Rob Bunting [phone]) Specimen Source:  Source: Launa Grill Order Number: [id number] Lab site:   Appended Document: Colonoscopy     Procedures Next Due Date:    Colonoscopy: 06/2015

## 2010-07-22 NOTE — Letter (Signed)
Summary: Results Letter  [facility]  [address] [place], [place] [address]   Phone: [phone]  Fax: [phone]        July 15, 2010 MRN: [id number]    [name] [address] [place], [place]  [address]    Dear [name],   Two of the polyps removed during your recent procedure were proven to be adenomatous.  These are pre-cancerous polyps that may have grown into cancers if they had not been removed.  Based on current nationally recognized surveillance guidelines, I recommend that you have a repeat colonoscopy in 5 years.  We will therefore put your information in our reminder system and will contact you in 5 years to schedule a repeat procedure.  Please call if you have any questions or concerns.       Sincerely,  Rachael Fee MD  This letter has been electronically signed by your physician.  Appended Document: Results Letter letter mailed

## 2010-12-05 ENCOUNTER — Ambulatory Visit (INDEPENDENT_AMBULATORY_CARE_PROVIDER_SITE_OTHER): Payer: PRIVATE HEALTH INSURANCE | Admitting: Family Medicine

## 2010-12-05 ENCOUNTER — Encounter: Payer: Self-pay | Admitting: Family Medicine

## 2010-12-05 VITALS — BP 122/82 | Temp 98.6°F | Wt 231.0 lb

## 2010-12-05 DIAGNOSIS — M25469 Effusion, unspecified knee: Secondary | ICD-10-CM

## 2010-12-05 MED ORDER — DICLOFENAC SODIUM 75 MG PO TBEC: 75.0000 mg | DELAYED_RELEASE_TABLET | Freq: Two times a day (BID) | ORAL | Status: DC

## 2010-12-05 NOTE — Progress Notes (Signed)
  Subjective:    Patient ID: Luis Ryan, male    DOB: 1960/12/12, 50 y.o.   MRN: [id number]  HPI Patient seen with swollen and moderately painful right knee. History of partial knee replacement 2 years ago. He is established orthopedist here. No specific injury but works on his legs does a lot of pushing and pulling. No locking or giving way. Using ibuprofen with some improvement already. No erythema or warmth.   Review of Systems  Constitutional: Negative for fever and chills.  Musculoskeletal: Negative for gait problem.       Objective:   Physical Exam  Constitutional: He appears well-developed and well-nourished.  Cardiovascular: Normal rate, regular rhythm and normal heart sounds.   Pulmonary/Chest: Effort normal and breath sounds normal. No respiratory distress. He has no wheezes. He has no rales.  Musculoskeletal:       Right knee reveals scar from previous surgery. Moderate effusion. No erythema or warmth. Full range of motion. No specific point tenderness. Anterior drawer test negative. Collateral ligament testing is stable          Assessment & Plan:  Small to moderate right knee effusion in patient with prior partial knee replacement. Continue icing. Diclofenac 75 mg twice daily with food and followup with orthopedist if symptoms persist over the next few weeks

## 2010-12-05 NOTE — Patient Instructions (Signed)
Follow up with Dr Charlann Boxer if knee pain persists.

## 2011-03-05 ENCOUNTER — Other Ambulatory Visit: Payer: Self-pay | Admitting: Family Medicine

## 2011-03-06 ENCOUNTER — Other Ambulatory Visit: Payer: Self-pay | Admitting: *Deleted

## 2011-03-06 MED ORDER — TRAMADOL HCL 50 MG PO TABS: 50.0000 mg | ORAL_TABLET | Freq: Four times a day (QID) | ORAL | Status: DC | PRN

## 2011-04-09 ENCOUNTER — Emergency Department (HOSPITAL_COMMUNITY): Payer: PRIVATE HEALTH INSURANCE

## 2011-04-09 ENCOUNTER — Emergency Department (HOSPITAL_COMMUNITY)
Admission: EM | Admit: 2011-04-09 | Discharge: 2011-04-09 | Disposition: A | Discharge status: Home / Self Care | Payer: PRIVATE HEALTH INSURANCE | Attending: Emergency Medicine | Admitting: Emergency Medicine

## 2011-04-09 ENCOUNTER — Encounter (HOSPITAL_COMMUNITY): Payer: Self-pay

## 2011-04-09 ENCOUNTER — Other Ambulatory Visit: Payer: Self-pay

## 2011-04-09 DIAGNOSIS — I10 Essential (primary) hypertension: Secondary | ICD-10-CM | POA: Insufficient documentation

## 2011-04-09 DIAGNOSIS — R079 Chest pain, unspecified: Secondary | ICD-10-CM | POA: Insufficient documentation

## 2011-04-09 DIAGNOSIS — E785 Hyperlipidemia, unspecified: Secondary | ICD-10-CM | POA: Insufficient documentation

## 2011-04-09 DIAGNOSIS — J209 Acute bronchitis, unspecified: Secondary | ICD-10-CM | POA: Insufficient documentation

## 2011-04-09 DIAGNOSIS — K219 Gastro-esophageal reflux disease without esophagitis: Secondary | ICD-10-CM | POA: Insufficient documentation

## 2011-04-09 LAB — CBC
MCV: 90.4 fL (ref 78.0–100.0)
Platelets: 196 10*3/uL (ref 150–400)
RBC: 4.98 MIL/uL (ref 4.22–5.81)
WBC: 18.4 10*3/uL — ABNORMAL HIGH (ref 4.0–10.5)

## 2011-04-09 LAB — DIFFERENTIAL
Lymphocytes Relative: 10 % — ABNORMAL LOW (ref 12–46)
Lymphs Abs: 1.8 10*3/uL (ref 0.7–4.0)
Neutrophils Relative %: 84 % — ABNORMAL HIGH (ref 43–77)

## 2011-04-09 LAB — BASIC METABOLIC PANEL
CO2: 26 mEq/L (ref 19–32)
Chloride: 99 mEq/L (ref 96–112)
Glucose, Bld: 87 mg/dL (ref 70–99)
Potassium: 4.4 mEq/L (ref 3.5–5.1)
Sodium: 135 mEq/L (ref 135–145)

## 2011-04-09 MED ORDER — AZITHROMYCIN 250 MG PO TABS: 250.0000 mg | ORAL_TABLET | Freq: Every day | ORAL | Status: AC

## 2011-04-09 MED ORDER — ASPIRIN 81 MG PO CHEW
324.0000 mg | CHEWABLE_TABLET | Freq: Once | ORAL | Status: AC
  Administered 2011-04-09: 324 mg via ORAL
  Filled 2011-04-09: qty 4

## 2011-04-09 NOTE — ED Notes (Signed)
Pt c/o chest congestion  X 3 days, and epigastric pain yesterday.

## 2011-04-09 NOTE — ED Provider Notes (Addendum)
History     CSN: [id number] Arrival date & time: 04/09/2011  8:53 AM   First MD Initiated Contact with Patient 04/09/11 0915      No chief complaint on file.   (Consider location/radiation/quality/duration/timing/severity/associated sxs/prior treatment) HPI Comments: The patient is a 50 year old male who presents for evaluation of what he perceives to be "chest congestion" which is been ongoing since Monday, 4 days ago, with occasional cough that's nonproductive, without fever or chills. 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 He reports a "burning" sensation at his upper epigastrium lower chest that was episodic, lasted 5 minutes or less, was not associated with exertion, shortness of breath, nausea, diaphoresis, or palpitations, and has resolved and not returned. He has no history of coronary artery disease or lung disease, but is a smoker. His main concern was whether he may have a bronchitis.  Patient is a 50 y.o. male presenting with cough and chest pain. The history is provided by the patient.  Cough This is a new problem. The current episode started more than 2 days ago. The problem occurs every few minutes. The problem has not changed since onset.The cough is non-productive. There has been no fever. Associated symptoms include chest pain and shortness of breath. Pertinent negatives include no chills, no sweats, no weight loss, no ear congestion, no ear pain, no headaches, no rhinorrhea, no sore throat, no myalgias, no wheezing and no eye redness. He has tried nothing for the symptoms. He is a smoker. His past medical history does not include pneumonia, COPD, emphysema or asthma.  Chest Pain The chest pain began 3 - 5 hours ago. Duration of episode(s) is 5 minutes. Chest pain occurs rarely. The chest pain is  resolved. The pain is associated with lifting and breathing. At its most intense, the pain is at 3/10. The pain is currently at 0/10. The severity of the pain is mild. The quality of the pain is described as burning. The pain does not radiate. Exacerbated by: Nothing. Primary symptoms include shortness of breath and cough. Pertinent negatives for primary symptoms include no fever, no fatigue, no syncope, no wheezing, no palpitations, no abdominal pain, no nausea, no vomiting, no dizziness and no altered mental status.  The patient's medical history does not include COPD or asthma.  Pertinent negatives for associated symptoms include no claudication, no diaphoresis, no lower extremity edema, no near-syncope, no numbness, no orthopnea, no paroxysmal nocturnal dyspnea and no weakness. He tried nothing for the symptoms. Risk factors include male gender and smoking/tobacco exposure.  His past medical history is significant for hypertension.  Pertinent negatives for past medical history include no CAD, no COPD, no CHF, no diabetes, no DVT, no hyperlipidemia, no MI, no PE and no strokes.  Pertinent negatives for family medical history include: no early MI in family.     Past Medical History  Diagnosis Date  . HYPERLIPIDEMIA 07/10/2009  . HYPERTENSION 07/10/2009  . GERD 07/10/2009  . 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 HIP PAIN, BILATERAL 09/10/2009  . PERS HX TOBACCO USE PRESENTING HAZARDS HEALTH 07/10/2009  . NECK MASS 05/29/2010    Past Surgical History  Procedure Date  . Knee surgery 2009  . 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 Hyperlipidemia Other     History  Substance Use Topics  . Smoking status: Current Everyday Smoker -- 1.0 packs/day for 30 years    Types: Cigarettes  . Smokeless tobacco: Not on file  . Alcohol Use: No      Review of Systems  Constitutional: Negative for fever, chills, weight loss, diaphoresis and  fatigue.  HENT: Negative for ear pain, sore throat and rhinorrhea.   Eyes: Negative for redness.  Respiratory: Positive for cough and shortness of breath. Negative for wheezing.   Cardiovascular: Positive for chest pain. Negative for palpitations, orthopnea, claudication, syncope and near-syncope.  Gastrointestinal: Negative for nausea, vomiting and abdominal pain.  Musculoskeletal: Negative for myalgias.  Neurological: Negative for dizziness, weakness, numbness and headaches.  Psychiatric/Behavioral: Negative for altered mental status.  All other systems reviewed and are negative.    Allergies  Review of patient's allergies indicates no known allergies.  Home Medications   Current Outpatient Rx  Name Route Sig Dispense Refill  . CYCLOBENZAPRINE HCL 10 MG PO TABS Oral Take 10 mg by mouth daily as needed.      Marland Kitchen DICLOFENAC SODIUM 75 MG PO TBEC Oral Take 1 tablet (75 mg total) by mouth 2 (two) times daily. 60 tablet 1  . LISINOPRIL-HYDROCHLOROTHIAZIDE 20-12.5 MG PO TABS Oral Take 1 tablet by mouth daily.      . TRAMADOL HCL 50 MG PO TABS Oral Take 1 tablet (50 mg total) by mouth every 6 (six) hours as needed. 30 tablet 0    BP 113/66  Pulse 93  Temp(Src) 98.7 F (37.1 C) (Oral)  Resp 18  SpO2 96%  Physical Exam  Nursing note and vitals reviewed. Constitutional: He is oriented to person, place, and time. He appears well-developed and well-nourished. No distress.  HENT:  Head: Normocephalic and atraumatic.  Mouth/Throat: Oropharynx is clear and moist.  Eyes: EOM are normal. Pupils are equal, round, and reactive to light.  Neck: Normal range of motion. Neck supple. No JVD present. No tracheal deviation present.  Cardiovascular: Normal rate, regular rhythm, S1 normal, S2 normal, normal heart sounds and intact distal pulses.   No extrasystoles are present. PMI is not displaced.  Exam reveals no gallop and no friction rub.   No murmur heard. Pulmonary/Chest: Effort normal and  breath sounds normal. No accessory muscle usage or stridor. Not tachypneic. No respiratory distress. He has no decreased breath sounds. He has no wheezes. He has no rhonchi. He has no rales. He exhibits no tenderness, no bony tenderness, no crepitus and no retraction.  Abdominal: Soft. Bowel sounds are normal. He exhibits no distension and no mass. There is no tenderness. There is no rebound and no guarding.  Musculoskeletal: Normal range of motion. He exhibits no edema and no tenderness.  Neurological: He is alert and oriented to person, place, and time. No cranial nerve deficit. He exhibits normal muscle tone.  Skin: Skin is warm and dry. No rash noted. He is not diaphoretic. No erythema. No pallor.  Psychiatric: He has a normal mood and affect. His behavior is normal. Judgment and thought content normal.    ED Course  Procedures (including critical care time)  Date: 04/09/2011  Rate: 85  Rhythm: normal sinus rhythm  QRS Axis: normal  Intervals: normal  ST/T Wave abnormalities: normal  Conduction Disutrbances:none  Narrative Interpretation: Non-provocative EKG  Old EKG Reviewed: none available   Labs Reviewed  TROPONIN I  CBC  DIFFERENTIAL  BASIC METABOLIC PANEL   No  results found.   No diagnosis found.    MDM  Bronchitis, viral upper respiratory tract infection, Musculoskeletal chest pain, costochondritis, GERD, Gastrointestinal Chest Pain, Pleuritic Chest Pain, Pneumonia, Pneumothorax, Pulmonary Embolism, Esophageal Spasm, Arrhythmia considered among other potential etiologies in the patient's differential diagnosis.  Acute coronary syndrome or myocardial infarction I thought to be much less likely in this patient given his history, physical examination, her medical history.  I will screen for myocardial infarction or cardiac ischemia with one troponin, and EKG, and chest x-ray.         Felisa Bonier, MD 04/09/11 1610  Felisa Bonier, MD 04/09/11 [phone]

## 2011-04-09 NOTE — ED Notes (Signed)
Patient transported to X-ray 

## 2011-05-05 ENCOUNTER — Ambulatory Visit (INDEPENDENT_AMBULATORY_CARE_PROVIDER_SITE_OTHER): Payer: PRIVATE HEALTH INSURANCE | Admitting: Family Medicine

## 2011-05-05 ENCOUNTER — Encounter: Payer: Self-pay | Admitting: Family Medicine

## 2011-05-05 VITALS — BP 130/82 | Temp 98.9°F | Wt 237.0 lb

## 2011-05-05 DIAGNOSIS — I10 Essential (primary) hypertension: Secondary | ICD-10-CM

## 2011-05-05 DIAGNOSIS — Z23 Encounter for immunization: Secondary | ICD-10-CM

## 2011-05-05 DIAGNOSIS — D1739 Benign lipomatous neoplasm of skin and subcutaneous tissue of other sites: Secondary | ICD-10-CM

## 2011-05-05 DIAGNOSIS — M1710 Unilateral primary osteoarthritis, unspecified knee: Secondary | ICD-10-CM

## 2011-05-05 DIAGNOSIS — D170 Benign lipomatous neoplasm of skin and subcutaneous tissue of head, face and neck: Secondary | ICD-10-CM

## 2011-05-05 MED ORDER — DICLOFENAC SODIUM 75 MG PO TBEC: 75.0000 mg | DELAYED_RELEASE_TABLET | Freq: Two times a day (BID) | ORAL | Status: DC

## 2011-05-05 MED ORDER — LISINOPRIL-HYDROCHLOROTHIAZIDE 20-12.5 MG PO TABS: 1.0000 | ORAL_TABLET | Freq: Every day | ORAL | Status: DC

## 2011-05-05 MED ORDER — TRAMADOL HCL 50 MG PO TABS: 50.0000 mg | ORAL_TABLET | Freq: Four times a day (QID) | ORAL | Status: DC | PRN

## 2011-05-05 NOTE — Progress Notes (Signed)
  Subjective:    Patient ID: Luis Ryan, male    DOB: 11-26-1960, 50 y.o.   MRN: [id number]  HPI  Followup hypertension and osteoarthritis. Had prior history of right knee replacement. Generally arthritis stable. He has some ongoing low back problems. Takes diclofenac and tramadol. He started back diclofenac couple weeks ago. No GI concerns. Takes lisinopril HCTZ for hypertension. Blood pressure well-controlled. No orthostasis. No consistent exercise.  Had episode of chest pain associated with respiratory illness and cough back in November.   Went to emergency department it was felt this was bronchitis related. The symptoms are fully clear. No exertional chest pain since then.  R neck mass.  Not actively growing or painful.  Lipoma by previous recent CT neck.  Review of Systems  Constitutional: Negative for fatigue.  Eyes: Negative for visual disturbance.  Respiratory: Negative for cough, chest tightness and shortness of breath.   Cardiovascular: Negative for chest pain, palpitations and leg swelling.  Musculoskeletal: Positive for back pain and arthralgias.  Neurological: Negative for dizziness, syncope, weakness, light-headedness and headaches.       Objective:   Physical Exam  Constitutional: He is oriented to person, place, and time. He appears well-developed and well-nourished. No distress.  Neck: Neck supple.       Patient has lipomatous mass right side of neck. Had assessment lipomatous masses which were evaluated by CT scan last winter with no atypical features. No recent change in growth and no pain.  Cardiovascular: Normal rate, regular rhythm and normal heart sounds.   Pulmonary/Chest: Effort normal and breath sounds normal. No respiratory distress. He has no wheezes. He has no rales.  Musculoskeletal: He exhibits no edema.  Neurological: He is alert and oriented to person, place, and time.  Psychiatric: He has a normal mood and affect. His behavior is normal.           Assessment & Plan:  #1 hypertension. Stable. Refill lisinopril for one year and work on weight loss #2 osteoarthritis involving multiple joints. Continue diclofenac and tramadol. Liver panel in 6 months if remains on diclofenac. Caution about potential for GI risk. #3 lipomas right neck. Patient may eventually wished to have excised. Routine followup 6 months for complete physical then.

## 2011-07-15 ENCOUNTER — Other Ambulatory Visit: Payer: Self-pay | Admitting: Family Medicine

## 2011-11-03 ENCOUNTER — Ambulatory Visit (INDEPENDENT_AMBULATORY_CARE_PROVIDER_SITE_OTHER): Payer: PRIVATE HEALTH INSURANCE | Admitting: Family Medicine

## 2011-11-03 ENCOUNTER — Encounter: Payer: Self-pay | Admitting: Family Medicine

## 2011-11-03 VITALS — BP 120/80 | Temp 98.7°F | Wt 238.0 lb

## 2011-11-03 DIAGNOSIS — I10 Essential (primary) hypertension: Secondary | ICD-10-CM

## 2011-11-03 DIAGNOSIS — M1710 Unilateral primary osteoarthritis, unspecified knee: Secondary | ICD-10-CM

## 2011-11-03 MED ORDER — TRAMADOL HCL 50 MG PO TABS: 50.0000 mg | ORAL_TABLET | Freq: Four times a day (QID) | ORAL | Status: DC | PRN

## 2011-11-03 NOTE — Progress Notes (Signed)
  Subjective:    Patient ID: Luis Ryan, male    DOB: 02/10/1961, 51 y.o.   MRN: [id number]  HPI  Patient for medical followup. Hypertension treated with lisinopril HCTZ. Blood pressure well-controlled. No dizziness or orthostasis. Osteoarthritis involving the knees. Takes Ultram one tablet twice daily and supplements occasionally with diclofenac. Arthritis symptoms are well controlled.  History right neck lipomas confirmed by CT scan.  No rapid growth or pain.   No adenopathy.  Past Medical History  Diagnosis Date  . HYPERLIPIDEMIA 07/10/2009  . HYPERTENSION 07/10/2009  . GERD 07/10/2009  . 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 HIP PAIN, BILATERAL 09/10/2009  . PERS HX TOBACCO USE PRESENTING HAZARDS HEALTH 07/10/2009  . NECK MASS 05/29/2010   Past Surgical History  Procedure Date  . Knee surgery 2009  . 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  He has a 30 pack-year smoking history. He does not have any smokeless tobacco history on file. He reports that he does not drink alcohol or use illicit drugs. family history includes Hyperlipidemia in his other. No Known Allergies    Review of Systems  Constitutional: Negative for fatigue.  Eyes: Negative for visual disturbance.  Respiratory: Negative for cough, chest tightness and shortness of breath.   Cardiovascular: Negative for chest pain, palpitations and leg swelling.  Neurological: Negative for dizziness, syncope, weakness, light-headedness and headaches.       Objective:   Physical Exam  Constitutional: He appears well-developed and well-nourished.  Neck: Neck supple.       Patient has large lipomatous masses right neck. Previously evaluated with CT scan last year which confirmed lipomas. Minimal if any growth in size since last year. Nonpainful.  Cardiovascular: Normal rate and regular rhythm.  Exam reveals no gallop.   Pulmonary/Chest: Effort normal and breath sounds  normal. No respiratory distress. He has no wheezes. He has no rales.  Musculoskeletal: He exhibits no edema.          Assessment & Plan:  #1 hypertension. Stable. Continue current medication #2 osteoarthritis involving knees. Refill Ultram for 6 months  #3 benign lipomas right neck #4 health maintenance. Recommend complete physical later this year

## 2011-11-03 NOTE — Patient Instructions (Signed)
Consider scheduling complete physical at some point later this year. 

## 2012-02-01 ENCOUNTER — Ambulatory Visit (INDEPENDENT_AMBULATORY_CARE_PROVIDER_SITE_OTHER): Payer: PRIVATE HEALTH INSURANCE | Admitting: Family Medicine

## 2012-02-01 VITALS — BP 120/78 | Temp 98.0°F | Wt 231.0 lb

## 2012-02-01 DIAGNOSIS — H8110 Benign paroxysmal vertigo, unspecified ear: Secondary | ICD-10-CM

## 2012-02-01 NOTE — Progress Notes (Signed)
  Subjective:    Patient ID: Luis Ryan, male    DOB: [birth year], 51 y.o.   MRN: [id number]  HPI  Patient seen for evaluation of dizziness. Onset one week ago today. First noted when he woke up. Symptoms are described as vertigo worse early morning and improved by noon. Worse moving to the left side. No hearing loss. No tinnitus. Has had some nasal congestion. No fever. No syncopal symptoms. Patient's symptoms have been improving and very mild since then. He's had some allergy symptoms and is using Claritin and Benadryl with good relief. Blood pressures been well controlled. No nausea or vomiting. No focal weakness. No ataxia  Past Medical History  Diagnosis Date  . HYPERLIPIDEMIA 07/10/2009  . HYPERTENSION 07/10/2009  . GERD 07/10/2009  . 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 HIP PAIN, BILATERAL 09/10/2009  . PERS HX TOBACCO USE PRESENTING HAZARDS HEALTH 07/10/2009  . NECK MASS 05/29/2010   Past Surgical History  Procedure Date  . Knee surgery 2009  . 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  He has a 30 pack-year smoking history. He does not have any smokeless tobacco history on file. He reports that he does not drink alcohol or use illicit drugs. family history includes Hyperlipidemia in his other. No Known Allergies    Review of Systems  Constitutional: Negative for fever, chills and appetite change.  HENT: Negative for hearing loss and ear pain.   Respiratory: Negative for cough and shortness of breath.   Cardiovascular: Negative for chest pain.  Neurological: Positive for dizziness. Negative for syncope, speech difficulty, weakness, light-headedness and headaches.  Psychiatric/Behavioral: Negative for confusion.       Objective:   Physical Exam  Constitutional: He is oriented to person, place, and time. He appears well-developed and well-nourished.  HENT:  Right Ear: External ear normal.  Left Ear: External ear normal.    Neck: Neck supple. No thyromegaly present.  Cardiovascular: Normal rate and regular rhythm.  Exam reveals no gallop.   Pulmonary/Chest: Effort normal and breath sounds normal. No respiratory distress. He has no wheezes. He has no rales.  Lymphadenopathy:    He has no cervical adenopathy.  Neurological: He is alert and oriented to person, place, and time. No cranial nerve deficit. Coordination normal.       No focal strength deficits          Assessment & Plan:  Vertigo. Suspect benign positional vertigo. Symptoms seem to be improving. Consider scopolamine patch for upcoming flight if still symptomatic. We have not recommended any medications at this time. Consider Epley maneuvers if symptoms persist

## 2012-02-01 NOTE — Patient Instructions (Addendum)

## 2012-06-28 ENCOUNTER — Encounter: Payer: Self-pay | Admitting: Family

## 2012-06-28 ENCOUNTER — Ambulatory Visit (INDEPENDENT_AMBULATORY_CARE_PROVIDER_SITE_OTHER): Payer: PRIVATE HEALTH INSURANCE | Admitting: Family

## 2012-06-28 VITALS — BP 142/64 | HR 81 | Temp 98.4°F | Ht 66.0 in | Wt 245.0 lb

## 2012-06-28 DIAGNOSIS — I10 Essential (primary) hypertension: Secondary | ICD-10-CM

## 2012-06-28 DIAGNOSIS — L259 Unspecified contact dermatitis, unspecified cause: Secondary | ICD-10-CM

## 2012-06-28 DIAGNOSIS — M1990 Unspecified osteoarthritis, unspecified site: Secondary | ICD-10-CM

## 2012-06-28 LAB — BASIC METABOLIC PANEL
BUN: 22 mg/dL (ref 6–23)
Chloride: 106 mEq/L (ref 96–112)
Glucose, Bld: 97 mg/dL (ref 70–99)
Potassium: 4.1 mEq/L (ref 3.5–5.1)

## 2012-06-28 MED ORDER — LISINOPRIL-HYDROCHLOROTHIAZIDE 20-12.5 MG PO TABS: 1.0000 | ORAL_TABLET | Freq: Every day | ORAL | Status: DC

## 2012-06-28 MED ORDER — TRAMADOL HCL 50 MG PO TABS: 50.0000 mg | ORAL_TABLET | Freq: Four times a day (QID) | ORAL | Status: DC | PRN

## 2012-06-28 NOTE — Progress Notes (Signed)
Subjective:    Patient ID: Luis Ryan, male    DOB: [birth year], 52 y.o.   MRN: [id number]  HPI 51 year old white male, nonsmoker, patient of Dr. Caryl Never is in today with a persistent dry, itchy, rash has been ongoing x3 years off and on after relocating to [place]. He applies Benadryl cream that helps with symptom improvement. Denies any known allergens. Denies any changes in detergents, soaps, lotions, medications. He is requesting a referral to an allergist.   Patient has a history of hypertension and is currently taken lisinopril HCT and tolerating it well. Requesting a refill. He also has a history of osteoarthritis. He is tolerating Ultram well and requesting a refill.  Review of Systems  HENT: Negative.   Respiratory: Negative.   Cardiovascular: Negative.   Genitourinary: Negative.   Musculoskeletal: Negative.   Skin: Positive for rash.  Neurological: Negative.   Hematological: Negative.   Psychiatric/Behavioral: Negative.    Past Medical History  Diagnosis Date  . HYPERLIPIDEMIA 07/10/2009  . HYPERTENSION 07/10/2009  . GERD 07/10/2009  . 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 HIP PAIN, BILATERAL 09/10/2009  . PERS HX TOBACCO USE PRESENTING HAZARDS HEALTH 07/10/2009  . NECK MASS 05/29/2010    History   Social History  . Marital Status: Married    Spouse Name: N/A    Number of Children: N/A  . Years of Education: N/A   Occupational History  . Not on file.   Social History Main Topics  . Smoking status: Current Every Day Smoker -- 1.0 packs/day for 30 years    Types: Cigarettes  . Smokeless tobacco: Not on file  . Alcohol Use: No  . Drug Use: No  . Sexually Active: Not on file   Other Topics Concern  . Not on file   Social History Narrative  . No narrative on file    Past Surgical History  Procedure Date  . Knee surgery 2009  . 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 Hyperlipidemia  Other     No Known Allergies  Current Outpatient Prescriptions on File Prior to Visit  Medication Sig Dispense Refill  . diphenhydrAMINE (BENADRYL) 25 MG tablet Take 25 mg by mouth every 6 (six) hours as needed.      Marland Kitchen lisinopril-hydrochlorothiazide (PRINZIDE,ZESTORETIC) 20-12.5 MG per tablet Take 1 tablet by mouth daily.  90 tablet  1  . loratadine (CLARITIN) 10 MG tablet Take 10 mg by mouth daily as needed.       . diclofenac (VOLTAREN) 75 MG EC tablet         BP 142/64  Pulse 81  Temp 98.4 F (36.9 C) (Oral)  Ht 5\' 6"  (1.676 m)  Wt 245 lb (111.131 kg)  BMI 39.54 kg/m2  SpO2 98%chart    Objective:   Physical Exam  Constitutional: He is oriented to person, place, and time. He appears well-developed and well-nourished.  HENT:  Right Ear: External ear normal.  Left Ear: External ear normal.  Nose: Nose normal.  Mouth/Throat: Oropharynx is clear and moist.  Neck: Normal range of motion. Neck supple.  Cardiovascular: Normal rate, regular rhythm and normal heart sounds.   Pulmonary/Chest: Effort normal and breath sounds normal.  Abdominal: Soft. Bowel sounds are normal.  Musculoskeletal: Normal range of motion.  Neurological: He is alert and oriented to person, place, and time.  Skin: Skin is warm and dry. Rash noted.  Red, papular, whelped appearing rash to the forearms bilaterally.   Psychiatric: He has a normal mood and affect.         Assessment & Plan:  Assessment: 1. Contact dermatitis 2. Hypertension 3. Osteoarthritis  Plan: Refer to allergist. He should declines any intervention at this point for contact dermatitis. Probably a good idea that he discontinue the Benadryl cream at this point in with the allergist actually see the rash. Refilled blood pressure medicine and tramadol for hypertension and osteoarthritis. BMP sent. Advised patient to schedule complete physical exam with Dr. Caryl Never in 3-4 months and sooner as needed.

## 2012-06-28 NOTE — Patient Instructions (Signed)

## 2013-01-07 ENCOUNTER — Other Ambulatory Visit: Payer: Self-pay | Admitting: Family

## 2013-02-10 ENCOUNTER — Telehealth: Payer: Self-pay

## 2013-02-10 NOTE — Telephone Encounter (Signed)
Can you please call and set appointment for this patient for pre-op clearance as soon as we can next week. Thank youuuu

## 2013-03-08 ENCOUNTER — Encounter: Payer: Self-pay | Admitting: Family Medicine

## 2013-03-08 ENCOUNTER — Ambulatory Visit (INDEPENDENT_AMBULATORY_CARE_PROVIDER_SITE_OTHER): Payer: PRIVATE HEALTH INSURANCE | Admitting: Family Medicine

## 2013-03-08 VITALS — BP 132/72 | HR 64 | Temp 97.9°F | Wt 254.0 lb

## 2013-03-08 DIAGNOSIS — D170 Benign lipomatous neoplasm of skin and subcutaneous tissue of head, face and neck: Secondary | ICD-10-CM

## 2013-03-08 DIAGNOSIS — Z01818 Encounter for other preprocedural examination: Secondary | ICD-10-CM

## 2013-03-08 DIAGNOSIS — I10 Essential (primary) hypertension: Secondary | ICD-10-CM

## 2013-03-08 DIAGNOSIS — M169 Osteoarthritis of hip, unspecified: Secondary | ICD-10-CM

## 2013-03-08 DIAGNOSIS — D1739 Benign lipomatous neoplasm of skin and subcutaneous tissue of other sites: Secondary | ICD-10-CM

## 2013-03-08 DIAGNOSIS — M1611 Unilateral primary osteoarthritis, right hip: Secondary | ICD-10-CM | POA: Insufficient documentation

## 2013-03-08 NOTE — Progress Notes (Signed)
  Subjective:    Patient ID: Luis Ryan, male    DOB: 03-21-1961, 52 y.o.   MRN: [id number]  HPI Patient here for surgical clearance. Right hip replacement. He's had previous right knee surgery without difficulty. He has history of hypertension treated with lisinopril HCTZ. He'll get preoperative labs in the hospital. He has no cardiac history. No pulmonary history. Nonsmoker. Blood pressures been well controlled. Denies any recent chest pains, dyspnea, syncope  He has right neck mass which is chronic and has been diagnosed as lipoma from previous scans. He states has been unchanged for several years. No swallowing difficulties.  No family history of premature CAD.  Past Medical History  Diagnosis Date  . HYPERLIPIDEMIA 07/10/2009  . HYPERTENSION 07/10/2009  . GERD 07/10/2009  . 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 HIP PAIN, BILATERAL 09/10/2009  . PERS HX TOBACCO USE PRESENTING HAZARDS HEALTH 07/10/2009  . NECK MASS 05/29/2010   Past Surgical History  Procedure Laterality Date  . Knee surgery  2009  . Eye surgery  1964    born cross eyed    reports that he has quit smoking. His smoking use included Cigarettes. He has a 30 pack-year smoking history. He quit smokeless tobacco use 9 days ago. He reports that he does not drink alcohol or use illicit drugs. family history includes Hyperlipidemia in his other. No Known Allergies    Review of Systems  Constitutional: Negative for fatigue.  Eyes: Negative for visual disturbance.  Respiratory: Negative for cough, chest tightness and shortness of breath.   Cardiovascular: Negative for chest pain, palpitations and leg swelling.  Gastrointestinal: Negative for abdominal pain.  Neurological: Negative for dizziness, syncope, weakness, light-headedness and headaches.  Hematological: Negative for adenopathy.       Objective:   Physical Exam  Constitutional: He appears well-developed and well-nourished.  HENT:  Mouth/Throat:  Oropharynx is clear and moist.  Neck: Neck supple. No thyromegaly present.  Patient has fairly large right neck lipomatous masses with one anterior cervical trial region another below the right lower mandible No adenopathy noted  Cardiovascular: Normal rate and regular rhythm.  Exam reveals no gallop and no friction rub.   No murmur heard. Pulmonary/Chest: Effort normal and breath sounds normal. No respiratory distress. He has no wheezes. He has no rales.  Abdominal: Soft. He exhibits no mass. There is no tenderness.  Lymphadenopathy:    He has no cervical adenopathy.  Skin: No rash noted.          Assessment & Plan:  #1 preoperative clearance. He has no cardiac or respiratory history. Previous EKGs have shown no acute findings. Denies any recent concerning symptoms. No known contraindications for surgery. Preoperative labs pending. #2 hypertension. Stable at goal #3 lipomas right neck. Per patient, unchanged #4 obesity. We have suggested working on weight loss and hopefully he can improve his activity levels after hip replacement surgery

## 2013-03-20 NOTE — Progress Notes (Signed)
Surgery clearance note Dr. Caryl Never 03/08/13 on chart

## 2013-03-20 NOTE — Patient Instructions (Addendum)
20 Luis Ryan  03/20/2013   Your procedure is scheduled on: 03/28/13  Report to [name] at 5:15 AM.  Call this number if you have problems the morning of surgery [phone]: [phone]   Remember:   Do not eat food or drink liquids After Midnight.     Take these medicines the morning of surgery with A SIP OF WATER: zyrtec if needed   Do not wear jewelry, make-up or nail polish.  Do not wear lotions, powders, or perfumes. You may wear deodorant.  Do not shave 48 hours prior to surgery. Men may shave face and neck.  Do not bring valuables to the hospital.  Contacts, dentures or bridgework may not be worn into surgery.  Leave suitcase in the car. After surgery it may be brought to your room.  For patients admitted to the hospital, checkout time is 11:00 AM the day of discharge.    Please read over the following fact sheets that you were given: MRSA Information, blood fact sheet, incentive spirometry fact sheet Birdie Sons, RN  pre op nurse call if needed [phone]    FAILURE TO FOLLOW THESE INSTRUCTIONS MAY RESULT IN CANCELLATION OF YOUR SURGERY   Patient Signature: ___________________________________________

## 2013-03-21 ENCOUNTER — Encounter (HOSPITAL_COMMUNITY)
Admission: RE | Admit: 2013-03-21 | Discharge: 2013-03-21 | Disposition: A | Discharge status: Home / Self Care | Payer: PRIVATE HEALTH INSURANCE | Source: Ambulatory Visit | Attending: Orthopedic Surgery | Admitting: Orthopedic Surgery

## 2013-03-21 ENCOUNTER — Encounter (HOSPITAL_COMMUNITY): Payer: Self-pay

## 2013-03-21 ENCOUNTER — Encounter (HOSPITAL_COMMUNITY): Payer: Self-pay | Admitting: Pharmacy Technician

## 2013-03-21 ENCOUNTER — Ambulatory Visit (HOSPITAL_COMMUNITY)
Admission: RE | Admit: 2013-03-21 | Discharge: 2013-03-21 | Disposition: A | Discharge status: Home / Self Care | Payer: PRIVATE HEALTH INSURANCE | Source: Ambulatory Visit | Attending: Orthopedic Surgery | Admitting: Orthopedic Surgery

## 2013-03-21 DIAGNOSIS — Z01812 Encounter for preprocedural laboratory examination: Secondary | ICD-10-CM | POA: Insufficient documentation

## 2013-03-21 DIAGNOSIS — M169 Osteoarthritis of hip, unspecified: Secondary | ICD-10-CM | POA: Insufficient documentation

## 2013-03-21 DIAGNOSIS — Z01810 Encounter for preprocedural cardiovascular examination: Secondary | ICD-10-CM | POA: Insufficient documentation

## 2013-03-21 DIAGNOSIS — M1610 Unilateral primary osteoarthritis, unspecified hip: Secondary | ICD-10-CM | POA: Insufficient documentation

## 2013-03-21 DIAGNOSIS — Z01818 Encounter for other preprocedural examination: Secondary | ICD-10-CM | POA: Insufficient documentation

## 2013-03-21 HISTORY — DX: Other seasonal allergic rhinitis: J30.2

## 2013-03-21 HISTORY — DX: Allergic rhinitis due to pollen: J30.1

## 2013-03-21 LAB — CBC
HCT: 40.7 % (ref 39.0–52.0)
Hemoglobin: 13.9 g/dL (ref 13.0–17.0)
RBC: 4.66 MIL/uL (ref 4.22–5.81)
WBC: 7.4 10*3/uL (ref 4.0–10.5)

## 2013-03-21 LAB — BASIC METABOLIC PANEL
BUN: 20 mg/dL (ref 6–23)
Chloride: 103 mEq/L (ref 96–112)
GFR calc Af Amer: >90 mL/min (ref >90)
GFR calc non Af Amer: >90 mL/min (ref >90)
Potassium: 4.2 mEq/L (ref 3.5–5.1)
Sodium: 138 mEq/L (ref 135–145)

## 2013-03-21 LAB — APTT: aPTT: 27 seconds (ref 24–37)

## 2013-03-21 LAB — PROTIME-INR
INR: 0.9 (ref 0.00–1.49)
Prothrombin Time: 12 seconds (ref 11.6–15.2)

## 2013-03-21 LAB — URINALYSIS, ROUTINE W REFLEX MICROSCOPIC
Leukocytes, UA: NEGATIVE
Nitrite: NEGATIVE
Specific Gravity, Urine: 1.026 (ref 1.005–1.030)
Urobilinogen, UA: 0.2 mg/dL (ref 0.0–1.0)
pH: 6 (ref 5.0–8.0)

## 2013-03-21 LAB — SURGICAL PCR SCREEN: Staphylococcus aureus: NEGATIVE

## 2013-03-21 NOTE — Progress Notes (Signed)
03/21/13 0842  OBSTRUCTIVE SLEEP APNEA  Have you ever been diagnosed with sleep apnea through a sleep study? No  Do you snore loudly (loud enough to be heard through closed doors)?  0  Do you often feel tired, fatigued, or sleepy during the daytime? 0  Has anyone observed you stop breathing during your sleep? 1  Do you have, or are you being treated for high blood pressure? 1  BMI more than 35 kg/m2? 1  Age over 53 years old? 1  Neck circumference greater than 40 cm/18 inches? 0  Gender: 1  Obstructive Sleep Apnea Score 5  Score 4 or greater  Results sent to PCP

## 2013-03-27 ENCOUNTER — Encounter (HOSPITAL_COMMUNITY): Payer: Self-pay | Admitting: Anesthesiology

## 2013-03-27 NOTE — H&P (Signed)
TOTAL HIP ADMISSION H&P  Patient is admitted for right total hip arthroplasty, anterior approach.  Subjective:  Chief Complaint:   Right hip OA / pain  HPI: Luis Ryan, 52 y.o. male, has a history of pain and functional disability in the right hip(s) due to arthritis and patient has failed non-surgical conservative treatments for greater than 12 weeks to include NSAID's and/or analgesics and activity modification.  Onset of symptoms was gradual starting 1 years ago with rapidlly worsening course since that time.The patient noted no past surgery on the right hip(s).  Patient currently rates pain in the right hip at 6 out of 10 with activity, but significant decrease in ROM. Patient has night pain, worsening of pain with activity and weight bearing, trendelenberg gait, pain that interfers with activities of daily living and pain with passive range of motion. Patient has evidence of periarticular osteophytes and joint space narrowing by imaging studies. This condition presents safety issues increasing the risk of falls.  There is no current active infection.  Risks, benefits and expectations were discussed with the patient. Patient understand the risks, benefits and expectations and wishes to proceed with surgery.   D/C Plans:   Home with HHPT  Post-op Meds:   No Rx given  Tranexamic Acid:   To be given  Decadron:    To be given  FYI:    ASA post-op   Patient Active Problem List   Diagnosis Date Noted  . Osteoarthritis of right hip 03/08/2013  . Severe obesity (BMI >= 40) 03/08/2013  . NECK MASS 05/29/2010  . HIP PAIN, BILATERAL 09/10/2009  . HYPERLIPIDEMIA 07/10/2009  . HYPERTENSION 07/10/2009  . GERD 07/10/2009  . 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 PERS HX TOBACCO USE PRESENTING HAZARDS HEALTH 07/10/2009   Past Medical History  Diagnosis Date  . HYPERLIPIDEMIA 07/10/2009    hx of   . HYPERTENSION 07/10/2009  . GERD 07/10/2009  . OSTEOARTHRITIS, KNEES, BILATERAL  07/10/2009  . HIP PAIN, BILATERAL 09/10/2009  . PERS HX TOBACCO USE PRESENTING HAZARDS HEALTH 07/10/2009  . NECK MASS 05/29/2010    "watching it"  . Allergy to trees   . Seasonal allergies     Past Surgical History  Procedure Laterality Date  . Knee surgery Right 2009    scope and partial replacement  . 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 Smoking status: Current Some Day Smoker -- 0.50 packs/day for 30 years    Types: Cigarettes  . Smokeless tobacco: Never Used  . Alcohol Use: Yes     Comment: rare    Family History  Problem Relation Age of Onset  . Hyperlipidemia Other      Review of Systems  Constitutional: Negative.   HENT: Negative.   Eyes: Negative.   Respiratory: Negative.   Cardiovascular: Negative.   Gastrointestinal: Positive for heartburn.  Genitourinary: Negative.   Musculoskeletal: Positive for joint pain.  Skin: Negative.   Neurological: Negative.   Endo/Heme/Allergies: Positive for environmental allergies.  Psychiatric/Behavioral: Negative.     Objective:  Physical Exam  Constitutional: He is oriented to person, place, and time. He appears well-developed and well-nourished.  HENT:  Head: Normocephalic and atraumatic.  Mouth/Throat: Oropharynx is clear and moist.  Eyes: Pupils are equal, round, and reactive to light.  Neck: Neck supple. No JVD present. No tracheal deviation present.  Mass on the  neck on the patient's right side. States that it has been checked out by his doctor and are fatty tumors and nothing to be done.  Cardiovascular: Normal rate, regular rhythm, normal heart sounds and intact distal pulses.   Respiratory: Effort normal and breath sounds normal.  GI: Soft. There is no tenderness. There is no guarding.  Musculoskeletal:       Right hip: He exhibits decreased range of motion, decreased strength, tenderness and bony tenderness. He  exhibits no swelling, no deformity and no laceration.  Neurological: He is alert and oriented to person, place, and time.  Skin: Skin is warm and dry.  Psychiatric: He has a normal mood and affect.    Labs:  Estimated body mass index is 39.56 kg/(m^2) as calculated from the following:   Height as of 06/28/12: 5\' 6"  (1.676 m).   Weight as of 06/28/12: 111.131 kg (245 lb).   Imaging Review Plain radiographs demonstrate severe degenerative joint disease of the right hip(s). The bone quality appears to be good for age and reported activity level.  Assessment/Plan:  End stage arthritis, right hip(s)  The patient history, physical examination, clinical judgement of the provider and imaging studies are consistent with end stage degenerative joint disease of the right hip(s) and total hip arthroplasty is deemed medically necessary. The treatment options including medical management, injection therapy, arthroscopy and arthroplasty were discussed at length. The risks and benefits of total hip arthroplasty were presented and reviewed. The risks due to aseptic loosening, infection, stiffness, dislocation/subluxation,  thromboembolic complications and other imponderables were discussed.  The patient acknowledged the explanation, agreed to proceed with the plan and consent was signed. Patient is being admitted for inpatient treatment for surgery, pain control, PT, OT, prophylactic antibiotics, VTE prophylaxis, progressive ambulation and ADL's and discharge planning.The patient is planning to be discharged home with home health services.     Luis Ryan Luis Ryan   PAC  03/27/2013, 6:11 PM

## 2013-03-27 NOTE — Anesthesia Preprocedure Evaluation (Addendum)
Anesthesia Evaluation  Patient identified by MRN, date of birth, ID band Patient awake    Reviewed: Allergy & Precautions, H&P , NPO status , Patient's Chart, lab work & pertinent test results  Airway Mallampati: II TM Distance: >3 FB Neck ROM: Full   Comment: Right submandibular lipoma, freely movable. No dysphagia. No voice change. Dental no notable dental hx.    Pulmonary neg pulmonary ROS,  breath sounds clear to auscultation  Pulmonary exam normal       Cardiovascular hypertension, Pt. on medications negative cardio ROS  Rhythm:Regular Rate:Normal     Neuro/Psych negative neurological ROS  negative psych ROS   GI/Hepatic Neg liver ROS, GERD-  ,  Endo/Other  Morbid obesity  Renal/GU negative Renal ROS  negative genitourinary   Musculoskeletal negative musculoskeletal ROS (+)   Abdominal (+) + obese,   Peds negative pediatric ROS (+)  Hematology negative hematology ROS (+)   Anesthesia Other Findings   Reproductive/Obstetrics negative OB ROS                          Anesthesia Physical Anesthesia Plan  ASA: III  Anesthesia Plan: General   Post-op Pain Management:    Induction: Intravenous  Airway Management Planned: Oral ETT  Additional Equipment:   Intra-op Plan:   Post-operative Plan: Extubation in OR  Informed Consent: I have reviewed the patients History and Physical, chart, labs and discussed the procedure including the risks, benefits and alternatives for the proposed anesthesia with the patient or authorized representative who has indicated his/her understanding and acceptance.   Dental advisory given  Plan Discussed with: CRNA  Anesthesia Plan Comments: (Discussed r/b general versus spinal. Patient prefers general.)       Anesthesia Quick Evaluation

## 2013-03-28 ENCOUNTER — Encounter (HOSPITAL_COMMUNITY): Payer: PRIVATE HEALTH INSURANCE | Admitting: Anesthesiology

## 2013-03-28 ENCOUNTER — Encounter (HOSPITAL_COMMUNITY)
Admission: RE | Disposition: A | Discharge status: Home Health | Payer: Self-pay | Source: Ambulatory Visit | Attending: Orthopedic Surgery

## 2013-03-28 ENCOUNTER — Inpatient Hospital Stay (HOSPITAL_COMMUNITY): Payer: PRIVATE HEALTH INSURANCE

## 2013-03-28 ENCOUNTER — Inpatient Hospital Stay (HOSPITAL_COMMUNITY)
Admission: RE | Admit: 2013-03-28 | Discharge: 2013-03-29 | DRG: 470 | Disposition: A | Discharge status: Home Health | Payer: PRIVATE HEALTH INSURANCE | Source: Ambulatory Visit | Attending: Orthopedic Surgery | Admitting: Orthopedic Surgery

## 2013-03-28 ENCOUNTER — Ambulatory Visit (HOSPITAL_COMMUNITY): Payer: PRIVATE HEALTH INSURANCE

## 2013-03-28 ENCOUNTER — Ambulatory Visit (HOSPITAL_COMMUNITY): Payer: PRIVATE HEALTH INSURANCE | Admitting: Anesthesiology

## 2013-03-28 ENCOUNTER — Encounter (HOSPITAL_COMMUNITY): Payer: Self-pay | Admitting: *Deleted

## 2013-03-28 DIAGNOSIS — F17200 Nicotine dependence, unspecified, uncomplicated: Secondary | ICD-10-CM | POA: Diagnosis present

## 2013-03-28 DIAGNOSIS — K219 Gastro-esophageal reflux disease without esophagitis: Secondary | ICD-10-CM | POA: Diagnosis present

## 2013-03-28 DIAGNOSIS — E785 Hyperlipidemia, unspecified: Secondary | ICD-10-CM | POA: Diagnosis present

## 2013-03-28 DIAGNOSIS — M169 Osteoarthritis of hip, unspecified: Principal | ICD-10-CM | POA: Diagnosis present

## 2013-03-28 DIAGNOSIS — D500 Iron deficiency anemia secondary to blood loss (chronic): Secondary | ICD-10-CM | POA: Diagnosis not present

## 2013-03-28 DIAGNOSIS — M1610 Unilateral primary osteoarthritis, unspecified hip: Principal | ICD-10-CM | POA: Diagnosis present

## 2013-03-28 DIAGNOSIS — Z96649 Presence of unspecified artificial hip joint: Secondary | ICD-10-CM

## 2013-03-28 DIAGNOSIS — D62 Acute posthemorrhagic anemia: Secondary | ICD-10-CM | POA: Diagnosis not present

## 2013-03-28 DIAGNOSIS — Z6839 Body mass index (BMI) 39.0-39.9, adult: Secondary | ICD-10-CM

## 2013-03-28 DIAGNOSIS — I10 Essential (primary) hypertension: Secondary | ICD-10-CM | POA: Diagnosis present

## 2013-03-28 HISTORY — PX: TOTAL HIP ARTHROPLASTY: SHX124

## 2013-03-28 LAB — TYPE AND SCREEN

## 2013-03-28 SURGERY — ARTHROPLASTY, HIP, TOTAL, ANTERIOR APPROACH
Anesthesia: General | Site: Hip | Laterality: Right | Wound class: Clean

## 2013-03-28 MED ORDER — ROCURONIUM BROMIDE 100 MG/10ML IV SOLN
INTRAVENOUS | Status: DC | PRN
  Administered 2013-03-28: 10 mg via INTRAVENOUS
  Administered 2013-03-28: 40 mg via INTRAVENOUS

## 2013-03-28 MED ORDER — KETAMINE HCL 10 MG/ML IJ SOLN
INTRAMUSCULAR | Status: DC | PRN
  Administered 2013-03-28: 20 mg via INTRAVENOUS

## 2013-03-28 MED ORDER — CEFAZOLIN SODIUM-DEXTROSE 2-3 GM-% IV SOLR
2.0000 g | INTRAVENOUS | Status: AC
  Administered 2013-03-28: 2 g via INTRAVENOUS

## 2013-03-28 MED ORDER — LORATADINE 10 MG PO TABS
10.0000 mg | ORAL_TABLET | Freq: Every day | ORAL | Status: DC
  Administered 2013-03-29: 10 mg via ORAL
  Filled 2013-03-28: qty 1

## 2013-03-28 MED ORDER — ZOLPIDEM TARTRATE 5 MG PO TABS: 5.0000 mg | ORAL_TABLET | Freq: Every evening | ORAL | Status: DC | PRN

## 2013-03-28 MED ORDER — ASPIRIN EC 325 MG PO TBEC
325.0000 mg | DELAYED_RELEASE_TABLET | Freq: Two times a day (BID) | ORAL | Status: DC
  Administered 2013-03-28 – 2013-03-29 (×2): 325 mg via ORAL
  Filled 2013-03-28 (×4): qty 1

## 2013-03-28 MED ORDER — HYDROMORPHONE HCL PF 1 MG/ML IJ SOLN: 0.5000 mg | INTRAMUSCULAR | Status: DC | PRN

## 2013-03-28 MED ORDER — METHOCARBAMOL 100 MG/ML IJ SOLN
500.0000 mg | Freq: Four times a day (QID) | INTRAVENOUS | Status: DC | PRN
  Administered 2013-03-28: 500 mg via INTRAVENOUS
  Filled 2013-03-28: qty 5

## 2013-03-28 MED ORDER — FENTANYL CITRATE 0.05 MG/ML IJ SOLN
INTRAMUSCULAR | Status: DC | PRN
  Administered 2013-03-28 (×2): 100 ug via INTRAVENOUS
  Administered 2013-03-28: 50 ug via INTRAVENOUS

## 2013-03-28 MED ORDER — HYDROMORPHONE HCL PF 1 MG/ML IJ SOLN
INTRAMUSCULAR | Status: DC | PRN
  Administered 2013-03-28 (×2): 1 mg via INTRAVENOUS

## 2013-03-28 MED ORDER — 0.9 % SODIUM CHLORIDE (POUR BTL) OPTIME
TOPICAL | Status: DC | PRN
  Administered 2013-03-28: 1000 mL

## 2013-03-28 MED ORDER — METHOCARBAMOL 500 MG PO TABS
500.0000 mg | ORAL_TABLET | Freq: Four times a day (QID) | ORAL | Status: DC | PRN
  Administered 2013-03-28 – 2013-03-29 (×2): 500 mg via ORAL
  Filled 2013-03-28 (×2): qty 1

## 2013-03-28 MED ORDER — NEOSTIGMINE METHYLSULFATE 1 MG/ML IJ SOLN
INTRAMUSCULAR | Status: DC | PRN
  Administered 2013-03-28: 4 mg via INTRAVENOUS

## 2013-03-28 MED ORDER — MENTHOL 3 MG MT LOZG: 1.0000 | LOZENGE | OROMUCOSAL | Status: DC | PRN

## 2013-03-28 MED ORDER — POLYETHYLENE GLYCOL 3350 17 G PO PACK: 17.0000 g | PACK | Freq: Every day | ORAL | Status: DC | PRN

## 2013-03-28 MED ORDER — FERROUS SULFATE 325 (65 FE) MG PO TABS
325.0000 mg | ORAL_TABLET | Freq: Three times a day (TID) | ORAL | Status: DC
  Administered 2013-03-29 (×2): 325 mg via ORAL
  Filled 2013-03-28 (×5): qty 1

## 2013-03-28 MED ORDER — PROMETHAZINE HCL 25 MG/ML IJ SOLN: 6.2500 mg | INTRAMUSCULAR | Status: DC | PRN

## 2013-03-28 MED ORDER — DIPHENHYDRAMINE HCL 25 MG PO CAPS: 25.0000 mg | ORAL_CAPSULE | Freq: Four times a day (QID) | ORAL | Status: DC | PRN

## 2013-03-28 MED ORDER — HYDROMORPHONE HCL PF 1 MG/ML IJ SOLN
INTRAMUSCULAR | Status: AC
  Filled 2013-03-28: qty 1

## 2013-03-28 MED ORDER — ONDANSETRON HCL 4 MG/2ML IJ SOLN
4.0000 mg | Freq: Four times a day (QID) | INTRAMUSCULAR | Status: DC | PRN
  Administered 2013-03-28: 4 mg via INTRAVENOUS
  Filled 2013-03-28: qty 2

## 2013-03-28 MED ORDER — ONDANSETRON HCL 4 MG/2ML IJ SOLN
INTRAMUSCULAR | Status: DC | PRN
  Administered 2013-03-28: 4 mg via INTRAVENOUS

## 2013-03-28 MED ORDER — LIDOCAINE HCL (CARDIAC) 20 MG/ML IV SOLN
INTRAVENOUS | Status: DC | PRN
  Administered 2013-03-28: 100 mg via INTRAVENOUS

## 2013-03-28 MED ORDER — MIDAZOLAM HCL 5 MG/5ML IJ SOLN
INTRAMUSCULAR | Status: DC | PRN
  Administered 2013-03-28: 2 mg via INTRAVENOUS

## 2013-03-28 MED ORDER — ALUM & MAG HYDROXIDE-SIMETH 200-200-20 MG/5ML PO SUSP: 30.0000 mL | ORAL | Status: DC | PRN

## 2013-03-28 MED ORDER — LACTATED RINGERS IV SOLN
INTRAVENOUS | Status: DC | PRN
  Administered 2013-03-28 (×2): via INTRAVENOUS

## 2013-03-28 MED ORDER — PROPOFOL 10 MG/ML IV BOLUS
INTRAVENOUS | Status: DC | PRN
  Administered 2013-03-28: 200 mg via INTRAVENOUS

## 2013-03-28 MED ORDER — HYDROMORPHONE HCL PF 1 MG/ML IJ SOLN
0.2500 mg | INTRAMUSCULAR | Status: DC | PRN
  Administered 2013-03-28 (×4): 0.5 mg via INTRAVENOUS

## 2013-03-28 MED ORDER — HYDROCODONE-ACETAMINOPHEN 7.5-325 MG PO TABS
1.0000 | ORAL_TABLET | ORAL | Status: DC
  Administered 2013-03-28: 12:00:00 1 via ORAL
  Administered 2013-03-28: 22:00:00 2 via ORAL
  Administered 2013-03-28: 18:00:00 1 via ORAL
  Administered 2013-03-29 (×2): 2 via ORAL
  Administered 2013-03-29: 1 via ORAL
  Filled 2013-03-28: qty 2
  Filled 2013-03-28 (×2): qty 1
  Filled 2013-03-28 (×2): qty 2
  Filled 2013-03-28: qty 1

## 2013-03-28 MED ORDER — CEFAZOLIN SODIUM-DEXTROSE 2-3 GM-% IV SOLR
INTRAVENOUS | Status: AC
  Filled 2013-03-28: qty 50

## 2013-03-28 MED ORDER — PHENOL 1.4 % MT LIQD: 1.0000 | OROMUCOSAL | Status: DC | PRN

## 2013-03-28 MED ORDER — DEXAMETHASONE SODIUM PHOSPHATE 10 MG/ML IJ SOLN
10.0000 mg | Freq: Once | INTRAMUSCULAR | Status: AC
  Administered 2013-03-29: 10 mg via INTRAVENOUS
  Filled 2013-03-28: qty 1

## 2013-03-28 MED ORDER — HYDROCHLOROTHIAZIDE 12.5 MG PO CAPS
12.5000 mg | ORAL_CAPSULE | Freq: Every day | ORAL | Status: DC
  Administered 2013-03-28 – 2013-03-29 (×2): 12.5 mg via ORAL
  Filled 2013-03-28 (×2): qty 1

## 2013-03-28 MED ORDER — SENNA 8.6 MG PO TABS
1.0000 | ORAL_TABLET | Freq: Two times a day (BID) | ORAL | Status: DC
  Administered 2013-03-28 – 2013-03-29 (×2): 8.6 mg via ORAL
  Filled 2013-03-28 (×3): qty 1

## 2013-03-28 MED ORDER — GLYCOPYRROLATE 0.2 MG/ML IJ SOLN
INTRAMUSCULAR | Status: DC | PRN
  Administered 2013-03-28: 0.6 mg via INTRAVENOUS

## 2013-03-28 MED ORDER — SUCCINYLCHOLINE CHLORIDE 20 MG/ML IJ SOLN
INTRAMUSCULAR | Status: DC | PRN
  Administered 2013-03-28: 100 mg via INTRAVENOUS

## 2013-03-28 MED ORDER — DOCUSATE SODIUM 100 MG PO CAPS
100.0000 mg | ORAL_CAPSULE | Freq: Two times a day (BID) | ORAL | Status: DC
  Administered 2013-03-28 – 2013-03-29 (×2): 100 mg via ORAL

## 2013-03-28 MED ORDER — SODIUM CHLORIDE 0.9 % IV SOLN
INTRAVENOUS | Status: DC
  Administered 2013-03-28 – 2013-03-29 (×2): via INTRAVENOUS
  Filled 2013-03-28 (×9): qty 1000

## 2013-03-28 MED ORDER — ONDANSETRON HCL 4 MG PO TABS: 4.0000 mg | ORAL_TABLET | Freq: Four times a day (QID) | ORAL | Status: DC | PRN

## 2013-03-28 MED ORDER — DEXAMETHASONE SODIUM PHOSPHATE 10 MG/ML IJ SOLN
INTRAMUSCULAR | Status: DC | PRN
  Administered 2013-03-28: 10 mg via INTRAVENOUS

## 2013-03-28 MED ORDER — CEFAZOLIN SODIUM-DEXTROSE 2-3 GM-% IV SOLR
2.0000 g | Freq: Four times a day (QID) | INTRAVENOUS | Status: AC
  Administered 2013-03-28 (×2): 2 g via INTRAVENOUS
  Filled 2013-03-28 (×2): qty 50

## 2013-03-28 MED ORDER — DEXAMETHASONE SODIUM PHOSPHATE 10 MG/ML IJ SOLN: 10.0000 mg | Freq: Once | INTRAMUSCULAR | Status: DC

## 2013-03-28 MED ORDER — LACTATED RINGERS IV SOLN: INTRAVENOUS | Status: DC

## 2013-03-28 MED ORDER — TRANEXAMIC ACID 100 MG/ML IV SOLN
1000.0000 mg | Freq: Once | INTRAVENOUS | Status: AC
  Administered 2013-03-28: 1000 mg via INTRAVENOUS
  Filled 2013-03-28: qty 10

## 2013-03-28 SURGICAL SUPPLY — 37 items
BAG ZIPLOCK 12X15 (MISCELLANEOUS) IMPLANT
BLADE SAW SGTL 18X1.27X75 (BLADE) ×2 IMPLANT
CAPT HIP PF COP ×2 IMPLANT
DERMABOND ADVANCED (GAUZE/BANDAGES/DRESSINGS) ×1
DERMABOND ADVANCED .7 DNX12 (GAUZE/BANDAGES/DRESSINGS) ×1 IMPLANT
DRAPE C-ARM 42X120 X-RAY (DRAPES) ×2 IMPLANT
DRAPE STERI IOBAN 125X83 (DRAPES) ×2 IMPLANT
DRAPE U-SHAPE 47X51 STRL (DRAPES) ×6 IMPLANT
DRSG AQUACEL AG ADV 3.5X10 (GAUZE/BANDAGES/DRESSINGS) ×2 IMPLANT
DRSG TEGADERM 4X4.75 (GAUZE/BANDAGES/DRESSINGS) IMPLANT
DURAPREP 26ML APPLICATOR (WOUND CARE) ×2 IMPLANT
ELECT BLADE TIP CTD 4 INCH (ELECTRODE) ×2 IMPLANT
ELECT REM PT RETURN 9FT ADLT (ELECTROSURGICAL) ×2
ELECTRODE REM PT RTRN 9FT ADLT (ELECTROSURGICAL) ×1 IMPLANT
EVACUATOR 1/8 PVC DRAIN (DRAIN) IMPLANT
FACESHIELD LNG OPTICON STERILE (SAFETY) ×8 IMPLANT
GAUZE SPONGE 2X2 8PLY STRL LF (GAUZE/BANDAGES/DRESSINGS) IMPLANT
GLOVE BIOGEL PI IND STRL 7.5 (GLOVE) ×1 IMPLANT
GLOVE BIOGEL PI IND STRL 8 (GLOVE) ×1 IMPLANT
GLOVE BIOGEL PI INDICATOR 7.5 (GLOVE) ×1
GLOVE BIOGEL PI INDICATOR 8 (GLOVE) ×1
GLOVE ECLIPSE 8.0 STRL XLNG CF (GLOVE) ×2 IMPLANT
GLOVE ORTHO TXT STRL SZ7.5 (GLOVE) ×4 IMPLANT
GOWN BRE IMP PREV XXLGXLNG (GOWN DISPOSABLE) ×2 IMPLANT
GOWN PREVENTION PLUS LG XLONG (DISPOSABLE) ×2 IMPLANT
KIT BASIN OR (CUSTOM PROCEDURE TRAY) ×2 IMPLANT
PACK TOTAL JOINT (CUSTOM PROCEDURE TRAY) ×2 IMPLANT
PADDING CAST COTTON 6X4 STRL (CAST SUPPLIES) ×2 IMPLANT
SPONGE GAUZE 2X2 STER 10/PKG (GAUZE/BANDAGES/DRESSINGS)
SUCTION FRAZIER 12FR DISP (SUCTIONS) IMPLANT
SUT MNCRL AB 4-0 PS2 18 (SUTURE) ×2 IMPLANT
SUT VIC AB 1 CT1 36 (SUTURE) ×6 IMPLANT
SUT VIC AB 2-0 CT1 27 (SUTURE) ×2
SUT VIC AB 2-0 CT1 TAPERPNT 27 (SUTURE) ×2 IMPLANT
SUT VLOC 180 0 24IN GS25 (SUTURE) ×2 IMPLANT
TOWEL OR 17X26 10 PK STRL BLUE (TOWEL DISPOSABLE) ×2 IMPLANT
TRAY FOLEY CATH 14FRSI W/METER (CATHETERS) ×2 IMPLANT

## 2013-03-28 NOTE — Transfer of Care (Signed)
Immediate Anesthesia Transfer of Care Note  Patient: Luis Ryan  Procedure(s) Performed: Procedure(s) (LRB): RIGHT TOTAL HIP ARTHROPLASTY ANTERIOR APPROACH (Right)  Patient Location: PACU  Anesthesia Type: General  Level of Consciousness: sedated, patient cooperative and responds to stimulation  Airway & Oxygen Therapy: Patient Spontanous Breathing and Patient connected to face mask oxgen  Post-op Assessment: Report given to PACU RN and Post -op Vital signs reviewed and stable  Post vital signs: Reviewed and stable  Complications: No apparent anesthesia complications

## 2013-03-28 NOTE — Anesthesia Postprocedure Evaluation (Signed)
  Anesthesia Post-op Note  Patient: Luis Ryan  Procedure(s) Performed: Procedure(s) (LRB): RIGHT TOTAL HIP ARTHROPLASTY ANTERIOR APPROACH (Right)  Patient Location: PACU  Anesthesia Type: General  Level of Consciousness: awake and alert   Airway and Oxygen Therapy: Patient Spontanous Breathing  Post-op Pain: mild  Post-op Assessment: Post-op Vital signs reviewed, Patient's Cardiovascular Status Stable, Respiratory Function Stable, Patent Airway and No signs of Nausea or vomiting  Last Vitals:  Filed Vitals:   03/28/13 1112  BP: 130/78  Pulse: 77  Temp: 36.6 C  Resp: 16    Post-op Vital Signs: stable   Complications: No apparent anesthesia complications

## 2013-03-28 NOTE — Op Note (Addendum)
NAME:  Luis Ryan                ACCOUNT NO.: [id number]      MEDICAL RECORD NO.: [id number]      FACILITY:  [facility]      PHYSICIAN:  Durene Romans D  DATE OF BIRTH:  Mar 11, 1961     DATE OF PROCEDURE:  03/28/2013                                 OPERATIVE REPORT         PREOPERATIVE DIAGNOSIS: Right  hip osteoarthritis.      POSTOPERATIVE DIAGNOSIS:  Right hip osteoarthritis.      PROCEDURE:  Right total hip replacement through an anterior approach   utilizing DePuy THR system, component size 52mm pinnacle cup, a size 36+4 neutral   Altrex liner, a size 4 Hi Tri Lock stem with a 36+1.5 delta ceramic   ball.      SURGEON:  Madlyn Frankel. Charlann Boxer, M.D.      ASSISTANT:  Leilani Able, PA-C     ANESTHESIA:  General.      SPECIMENS:  None.      COMPLICATIONS:  None.      BLOOD LOSS:  300 cc     DRAINS:  None.      INDICATION OF THE PROCEDURE:  Luis Ryan is a 52 y.o. male who had   presented to office for evaluation of right hip pain.  Radiographs revealed   progressive degenerative changes with bone-on-bone   articulation to the  hip joint.  The patient had painful limited range of   motion significantly affecting their overall quality of life.  The patient was failing to    respond to conservative measures, and at this point was ready   to proceed with more definitive measures.  The patient has noted progressive   degenerative changes in his hip, progressive problems and dysfunction   with regarding the hip prior to surgery.  Consent was obtained for   benefit of pain relief.  Specific risk of infection, DVT, component   failure, dislocation, need for revision surgery, as well discussion of   the anterior versus posterior approach were reviewed.  Consent was   obtained for benefit of anterior pain relief through an anterior   approach.      PROCEDURE IN DETAIL:  The patient was brought to operative theater.   Once adequate anesthesia,  preoperative antibiotics, 2gm Ancef administered.   The patient was positioned supine on the OSI Hanna table.  Once adequate   padding of boney process was carried out, we had predraped out the hip, and  used fluoroscopy to confirm orientation of the pelvis and position.      The right hip was then prepped and draped from proximal iliac crest to   mid thigh with shower curtain technique.      Time-out was performed identifying the patient, planned procedure, and   extremity.     An incision was then made 2 cm distal and lateral to the   anterior superior iliac spine extending over the orientation of the   tensor fascia lata muscle and sharp dissection was carried down to the   fascia of the muscle and protractor placed in the soft tissues.      The fascia was then incised.  The muscle belly was identified and swept   laterally  and retractor placed along the superior neck.  Following   cauterization of the circumflex vessels and removing some pericapsular   fat, a second cobra retractor was placed on the inferior neck.  A third   retractor was placed on the anterior acetabulum after elevating the   anterior rectus.  A L-capsulotomy was along the line of the   superior neck to the trochanteric fossa, then extended proximally and   distally.  Tag sutures were placed and the retractors were then placed   intracapsular.  We then identified the trochanteric fossa and   orientation of my neck cut, confirmed this radiographically   and then made a neck osteotomy with the femur on traction.  The femoral   head was removed without difficulty or complication.  Traction was let   off and retractors were placed posterior and anterior around the   acetabulum.      The labrum and foveal tissue were debrided.  I began reaming with a 47mm   reamer and reamed up to 51mm reamer with good bony bed preparation and a 52   cup was chosen.  The final 52mm Pinnacle cup was then impacted under fluoroscopy  to  confirm the depth of penetration and orientation with respect to   abduction.  A screw was placed followed by the hole eliminator.  The final   36+4 neutral Altrex liner was impacted with good visualized rim fit.  The cup was positioned anatomically within the acetabular portion of the pelvis.      At this point, the femur was rolled at 80 degrees.  Further capsule was   released off the inferior aspect of the femoral neck.  I then   released the superior capsule proximally.  The hook was placed laterally   along the femur and elevated manually and held in position with the bed   hook.  The leg was then extended and adducted with the leg rolled to 100   degrees of external rotation.  Once the proximal femur was fully   exposed, I used a box osteotome to set orientation.  I then began   broaching with the starting chili pepper broach and passed this by hand and then broached up to 4.  With the 4 broach in place I chose a high offset neck and did a trial reduction.  The offset was appropriate, leg lengths   appeared to be equal, confirmed radiographically.   Given these findings, I went ahead and dislocated the hip, repositioned all   retractors and positioned the right hip in the extended and abducted position.  The final 4 Hi Tri Lock stem was   chosen and it was impacted down to the level of neck cut.  Based on this   and the trial reduction, a 36+1.5 delta ceramic ball was chosen and   impacted onto a clean and dry trunnion, and the hip was reduced.  The   hip had been irrigated throughout the case again at this point.  I did   reapproximate the superior capsular leaflet to the anterior leaflet   using #1 Vicryl.  The fascia of the   tensor fascia lata muscle was then reapproximated using #1 Vicryl and 0 V-lock sutures.  The   remaining wound was closed with 2-0 Vicryl and running 4-0 Monocryl.   The hip was cleaned, dried, and dressed sterilely using Dermabond and   Aquacel dressing.  He  was then brought   to recovery room in  stable condition tolerating the procedure well.    Leilani Able, PA-C was present for the entirety of the case involved from   preoperative positioning, perioperative retractor management, general   facilitation of the case, as well as primary wound closure as assistant.            Madlyn Frankel Charlann Boxer, M.D.            MDO/MEDQ  D:  03/17/2011  T:  03/17/2011  Job:  [id number]      Electronically Signed by Durene Romans M.D. on 03/23/2011 09:15:38 AM

## 2013-03-28 NOTE — Plan of Care (Signed)
Problem: Consults Goal: Diagnosis- Total Joint Replacement Primary Total Hip     

## 2013-03-28 NOTE — Evaluation (Signed)
Physical Therapy Evaluation Patient Details Name: Luis Ryan MRN: [id number] DOB: October 25, 1960 Today's Date: 03/28/2013 Time: 7829-5621 PT Time Calculation (min): 18 min  PT Assessment / Plan / Recommendation History of Present Illness  R DA THA  Clinical Impression  Pt ambulated x 65'. Pt plans Dc to home. Will need a RW. Pt will benefit from PT to address problems listed.    PT Assessment  Patient needs continued PT services    Follow Up Recommendations  Home health PT    Does the patient have the potential to tolerate intense rehabilitation      Barriers to Discharge        Equipment Recommendations  Rolling walker with 5" wheels    Recommendations for Other Services     Frequency 7X/week    Precautions / Restrictions Precautions Precautions: Fall   Pertinent Vitals/Pain Pt reports sorenes in thigh.      Mobility  Bed Mobility Bed Mobility: Supine to Sit;Sit to Supine Supine to Sit: 4: Min assist;HOB flat;With rails Sit to Supine: 4: Min assist;HOB flat Details for Bed Mobility Assistance: cues for technique, trunk position on bed. Transfers Transfers: Sit to Stand;Stand to Sit Sit to Stand: 4: Min assist;From bed;From elevated surface;With upper extremity assist Stand to Sit: 4: Min assist;To bed;With upper extremity assist Details for Transfer Assistance: cues for use of UE's, R leg position prior to sitting. Ambulation/Gait Ambulation/Gait Assistance: 4: Min assist Ambulation Distance (Feet): 65 Feet Assistive device: Rolling walker Ambulation/Gait Assistance Details: cues for sequence, step lenght, safe use of RW Gait Pattern: Step-through pattern;Decreased stride length    Exercises     PT Diagnosis: Acute pain;Difficulty walking  PT Problem List: Decreased strength;Decreased range of motion;Decreased activity tolerance;Decreased mobility;Decreased knowledge of use of DME;Decreased safety awareness;Decreased knowledge of precautions;Pain PT  Treatment Interventions: DME instruction;Gait training;Stair training;Functional mobility training;Therapeutic activities;Therapeutic exercise;Patient/family education     PT Goals(Current goals can be found in the care plan section) Acute Rehab PT Goals Patient Stated Goal: I want to walk without pain PT Goal Formulation: With patient Time For Goal Achievement: 03/31/13 Potential to Achieve Goals: Good  Visit Information  Last PT Received On: 03/28/13 Assistance Needed: +1 History of Present Illness: R DA THA       Prior Functioning  Home Living Family/patient expects to be discharged to:: Private residence Living Arrangements: Spouse/significant other Available Help at Discharge: Family Type of Home: House Home Access: Stairs to enter Secretary/administrator of Steps: 2 Home Layout: Two level Alternate Level Stairs-Number of Steps: 14 Alternate Level Stairs-Rails: Right Home Equipment: Crutches Prior Function Level of Independence: Independent Communication Communication: No difficulties    Cognition  Cognition Arousal/Alertness: Awake/alert Behavior During Therapy: WFL for tasks assessed/performed Overall Cognitive Status: Within Functional Limits for tasks assessed    Extremity/Trunk Assessment Upper Extremity Assessment Upper Extremity Assessment: Defer to OT evaluation Lower Extremity Assessment Lower Extremity Assessment: RLE deficits/detail RLE Deficits / Details: able to advance R leg during gait.    Balance    End of Session PT - End of Session Activity Tolerance: Patient tolerated treatment well Patient left: in bed;with call bell/phone within reach Nurse Communication: Mobility status  GP     Luis Ryan 03/28/2013, 5:15 PM

## 2013-03-28 NOTE — Preoperative (Signed)
Beta Blockers   Reason not to administer Beta Blockers:Not Applicable 

## 2013-03-28 NOTE — Interval H&P Note (Signed)
History and Physical Interval Note:  03/28/2013 6:46 AM  Luis Ryan  has presented today for surgery, with the diagnosis of RIGHT HIP OA  The various methods of treatment have been discussed with the patient and family. After consideration of risks, benefits and other options for treatment, the patient has consented to  Procedure(s): RIGHT TOTAL HIP ARTHROPLASTY ANTERIOR APPROACH (Right) as a surgical intervention .  The patient's history has been reviewed, patient examined, no change in status, stable for surgery.  I have reviewed the patient's chart and labs.  Questions were answered to the patient's satisfaction.     [name]

## 2013-03-29 DIAGNOSIS — D500 Iron deficiency anemia secondary to blood loss (chronic): Secondary | ICD-10-CM | POA: Diagnosis not present

## 2013-03-29 DIAGNOSIS — Z96649 Presence of unspecified artificial hip joint: Secondary | ICD-10-CM

## 2013-03-29 LAB — BASIC METABOLIC PANEL
BUN: 18 mg/dL (ref 6–23)
CO2: 30 mEq/L (ref 19–32)
Chloride: 99 mEq/L (ref 96–112)
GFR calc Af Amer: >90 mL/min (ref >90)
Potassium: 4.1 mEq/L (ref 3.5–5.1)

## 2013-03-29 LAB — CBC
HCT: 36 % — ABNORMAL LOW (ref 39.0–52.0)
Hemoglobin: 12 g/dL — ABNORMAL LOW (ref 13.0–17.0)
MCV: 88 fL (ref 78.0–100.0)
Platelets: 195 10*3/uL (ref 150–400)
RBC: 4.09 MIL/uL — ABNORMAL LOW (ref 4.22–5.81)
RDW: 13.7 % (ref 11.5–15.5)
WBC: 13.8 10*3/uL — ABNORMAL HIGH (ref 4.0–10.5)

## 2013-03-29 MED ORDER — HYDROCODONE-ACETAMINOPHEN 7.5-325 MG PO TABS: 1.0000 | ORAL_TABLET | ORAL | Status: DC | PRN

## 2013-03-29 MED ORDER — FERROUS SULFATE 325 (65 FE) MG PO TABS: 325.0000 mg | ORAL_TABLET | Freq: Three times a day (TID) | ORAL | Status: DC

## 2013-03-29 MED ORDER — ASPIRIN 325 MG PO TBEC: 325.0000 mg | DELAYED_RELEASE_TABLET | Freq: Two times a day (BID) | ORAL | Status: AC

## 2013-03-29 MED ORDER — DSS 100 MG PO CAPS: 100.0000 mg | ORAL_CAPSULE | Freq: Two times a day (BID) | ORAL | Status: DC

## 2013-03-29 MED ORDER — POLYETHYLENE GLYCOL 3350 17 G PO PACK: 17.0000 g | PACK | Freq: Every day | ORAL | Status: DC | PRN

## 2013-03-29 MED ORDER — METHOCARBAMOL 500 MG PO TABS: 500.0000 mg | ORAL_TABLET | Freq: Four times a day (QID) | ORAL | Status: DC | PRN

## 2013-03-29 NOTE — Plan of Care (Signed)
Problem: Consults Goal: Diagnosis- Total Joint Replacement Outcome: Completed/Met Date Met:  03/29/13 Primary Total Hip Right, Anterior     

## 2013-03-29 NOTE — Evaluation (Signed)
Occupational Therapy Evaluation Patient Details Name: Ilya Neely MRN: [id number] DOB: January 02, 1961 Today's Date: 03/29/2013 Time: 4098-1191 OT Time Calculation (min): 17 min  OT Assessment / Plan / Recommendation History of present illness R DA THA   Clinical Impression   Pt was admitted for the above.  All education was completed.  Pt does not need any further OT at this time.       OT Assessment  Patient does not need any further OT services    Follow Up Recommendations  No OT follow up    Barriers to Discharge      Equipment Recommendations  3 in 1 bedside comode (please have DME representative give cost info if pt has to pay for it or has co-pay  )    Recommendations for Other Services    Frequency       Precautions / Restrictions Precautions Precautions: Fall Restrictions Weight Bearing Restrictions: No   Pertinent Vitals/Pain 5/10 after OT; repositioned with ice (R hip)    ADL  Toilet Transfer: Min guard Toilet Transfer Method: Sit to Barista: Comfort height toilet;Grab bars Tub/Shower Transfer: Insurance risk surveyor Method: Science writer: Walk in Scientist, research (physical sciences) Used: Rolling walker Transfers/Ambulation Related to ADLs: ambulated to bathroom; supervision level.  Pt given min guard for relatively low surface height of commode and to back into shower ADL Comments: Pt needs overall mod A for LB adls and set up for UB--family will assist with adls, but pt may get a long sponge for legs    OT Diagnosis:    OT Problem List:   OT Treatment Interventions:     OT Goals(Current goals can be found in the care plan section)    Visit Information  Last OT Received On: 03/29/13 Assistance Needed: +1 History of Present Illness: R DA THA       Prior Functioning     Home Living Family/patient expects to be discharged to:: Private residence Living Arrangements: Spouse/significant other Additional  Comments: does not have anything to push up from next to commode Prior Function Level of Independence: Independent Communication Communication: No difficulties         Vision/Perception     Cognition  Cognition Arousal/Alertness: Awake/alert Behavior During Therapy: WFL for tasks assessed/performed Overall Cognitive Status: Within Functional Limits for tasks assessed    Extremity/Trunk Assessment Upper Extremity Assessment Upper Extremity Assessment: Overall WFL for tasks assessed     Mobility Bed Mobility Supine to Sit: 4: Min assist;HOB elevated Details for Bed Mobility Assistance: no cues needed Transfers Sit to Stand: 5: Supervision;4: Min guard;From bed;From toilet (supervision from high bed; min guard from toilet) Details for Transfer Assistance: cues for leg position     Exercise     Balance     End of Session OT - End of Session Activity Tolerance: Patient tolerated treatment well Patient left: in bed;with call bell/phone within reach;with family/visitor present  GO     Latresha Yahr 03/29/2013, 8:52 AM Marica Otter, OTR/L [phone] 03/29/2013

## 2013-03-29 NOTE — Progress Notes (Signed)
[facility]  [facility] is providing the following services: RW and Commode  If patient discharges after hours, please call [phone].   Luis Ryan 03/29/2013, 9:53 AM

## 2013-03-29 NOTE — Progress Notes (Signed)
Physical Therapy Treatment Patient Details Name: Luis Ryan MRN: [id number] DOB: 05-Jan-1961 Today's Date: 03/29/2013 Time: 4098-1191 PT Time Calculation (min): 36 min  PT Assessment / Plan / Recommendation  History of Present Illness R DA THA   PT Comments   Pt progressing well  Follow Up Recommendations  Home health PT     Does the patient have the potential to tolerate intense rehabilitation     Barriers to Discharge        Equipment Recommendations  Rolling walker with 5" wheels    Recommendations for Other Services    Frequency     Progress towards PT Goals Progress towards PT goals: Progressing toward goals  Plan Current plan remains appropriate    Precautions / Restrictions Precautions Precautions: Fall Restrictions Weight Bearing Restrictions: No   Pertinent Vitals/Pain "sore hip"    Mobility  Bed Mobility Bed Mobility: Supine to Sit Supine to Sit: 4: Min guard Details for Bed Mobility Assistance: no cues needed Transfers Transfers: Sit to Stand;Stand to Sit Sit to Stand: 5: Supervision;From elevated surface;From bed;From chair/3-in-1 Stand to Sit: 5: Supervision;4: Min guard;To chair/3-in-1;With upper extremity assist Details for Transfer Assistance: cues for leg position Ambulation/Gait Ambulation/Gait Assistance: 4: Min guard;5: Supervision Ambulation Distance (Feet): 170 Feet Assistive device: Rolling walker Ambulation/Gait Assistance Details: cues for RW safety and step through Gait Pattern: Step-through pattern;Decreased stride length Stairs: Yes Stairs Assistance: 4: Min guard Stair Management Technique: One rail Right;Sideways;Step to pattern Number of Stairs: 6    Exercises Total Joint Exercises Ankle Circles/Pumps: AROM;Both;10 reps Quad Sets: AROM Heel Slides: AROM;AAROM;Left;10 reps Hip ABduction/ADduction: AROM;AAROM;Left;10 reps   PT Diagnosis:    PT Problem List:   PT Treatment Interventions:     PT Goals (current goals can  now be found in the care plan section) Acute Rehab PT Goals Patient Stated Goal: I want to walk without pain Time For Goal Achievement: 03/31/13 Potential to Achieve Goals: Good  Visit Information  Last PT Received On: 03/29/13 Assistance Needed: +1 History of Present Illness: R DA THA    Subjective Data  Patient Stated Goal: I want to walk without pain   Cognition  Cognition Arousal/Alertness: Awake/alert Behavior During Therapy: WFL for tasks assessed/performed Overall Cognitive Status: Within Functional Limits for tasks assessed    Balance     End of Session PT - End of Session Activity Tolerance: Patient tolerated treatment well Patient left: in chair;with call bell/phone within reach;with family/visitor present   GP     [facility] 03/29/2013, 11:52 AM

## 2013-03-29 NOTE — Discharge Summary (Signed)
Physician Discharge Summary  Patient ID: Luis Ryan MRN: [id number] DOB/AGE: [birth year] 52 y.o.  Admit date: 03/28/2013 Discharge date: 03/29/2013   Procedures:  Procedure(s) (LRB): RIGHT TOTAL HIP ARTHROPLASTY ANTERIOR APPROACH (Right)  Attending Physician:  Dr. Durene Romans   Admission Diagnoses:   Right hip OA / pain  Discharge Diagnoses:  Principal Problem:   S/P right THA, AA Active Problems:   Morbid obesity   Expected blood loss anemia  Past Medical History  Diagnosis Date  . HYPERLIPIDEMIA 07/10/2009    hx of   . HYPERTENSION 07/10/2009  . GERD 07/10/2009  . 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 HIP PAIN, BILATERAL 09/10/2009  . PERS HX TOBACCO USE PRESENTING HAZARDS HEALTH 07/10/2009  . NECK MASS 05/29/2010    "watching it"  . Allergy to trees   . Seasonal allergies     HPI: Luis Ryan, 52 y.o. male, has a history of pain and functional disability in the right hip(s) due to arthritis and patient has failed non-surgical conservative treatments for greater than 12 weeks to include NSAID's and/or analgesics and activity modification. Onset of symptoms was gradual starting 1 years ago with rapidlly worsening course since that time.The patient noted no past surgery on the right hip(s). Patient currently rates pain in the right hip at 6 out of 10 with activity, but significant decrease in ROM. Patient has night pain, worsening of pain with activity and weight bearing, trendelenberg gait, pain that interfers with activities of daily living and pain with passive range of motion. Patient has evidence of periarticular osteophytes and joint space narrowing by imaging studies. This condition presents safety issues increasing the risk of falls. There is no current active infection. Risks, benefits and expectations were discussed with the patient. Patient understand the risks, benefits and expectations and wishes to proceed with surgery.  PCP: Luis Covey, MD    Discharged Condition: good  Hospital Course:  Patient underwent the above stated procedure on 03/28/2013. Patient tolerated the procedure well and brought to the recovery room in good condition and subsequently to the floor.  POD #1 BP: 145/85 ; Pulse: 83 ; Temp: 98.3 F (36.8 C) ; Resp: 16  Pt's foley was removed, as well as the hemovac drain removed. IV was changed to a saline lock. Patient reports pain as mild, pain well controlled. No events throughout the night. Ready to be discharged home if he does well with PT and pain stays well controlled.  Neurovascular intact, dorsiflexion/plantar flexion intact, incision: dressing C/D/I, no cellulitis present and compartment soft.   LABS  Basename    HGB  12.0  HCT  36.0    Discharge Exam: General appearance: alert, cooperative and no distress Extremities: Homans sign is negative, no sign of DVT, no edema, redness or tenderness in the calves or thighs and no ulcers, gangrene or trophic changes  Disposition:   Home or Self Care with follow up in 2 weeks   Follow-up Information   Follow up with [name], MD. Schedule an appointment as soon as possible for a visit in 2 weeks.   Specialty:  Orthopedic Surgery   Contact information:   [address] [place] [address] [phone]       Discharge Orders   Future Orders Complete By Expires   Call MD / Call 911  As directed    Comments:     If you experience chest pain or shortness of breath, CALL 911 and be transported to the hospital emergency  room.  If you develope a fever above 101 F, pus (white drainage) or increased drainage or redness at the wound, or calf pain, call your surgeon's office.   Change dressing  As directed    Comments:     Maintain surgical dressing for 10-14 days, then replace with 4x4 guaze and tape. Keep the area dry and clean.   Constipation Prevention  As directed    Comments:     Drink plenty of fluids.  Prune juice may be  helpful.  You may use a stool softener, such as Colace (over the counter) 100 mg twice a day.  Use MiraLax (over the counter) for constipation as needed.   Diet - low sodium heart healthy  As directed    Discharge instructions  As directed    Comments:     Maintain surgical dressing for 10-14 days, then replace with gauze and tape. Keep the area dry and clean until follow up. Follow up in 2 weeks at [facility]. Call with any questions or concerns.   Driving restrictions  As directed    Comments:     No driving for 4 weeks   Increase activity slowly as tolerated  As directed    TED hose  As directed    Comments:     Use stockings (TED hose) for 2 weeks on both leg(s).  You may remove them at night for sleeping.   Weight bearing as tolerated  As directed    Questions:     Laterality:     Extremity:          Medication List    STOP taking these medications       ibuprofen 200 MG tablet  Commonly known as:  ADVIL,MOTRIN     traMADol 50 MG tablet  Commonly known as:  ULTRAM      TAKE these medications       aspirin 325 MG EC tablet  Take 1 tablet (325 mg total) by mouth 2 (two) times daily.     cetirizine 10 MG tablet  Commonly known as:  ZYRTEC  Take 10 mg by mouth daily.     diphenhydrAMINE 25 MG tablet  Commonly known as:  BENADRYL  Take 25 mg by mouth every 6 (six) hours as needed for allergies.     DSS 100 MG Caps  Take 100 mg by mouth 2 (two) times daily.     ferrous sulfate 325 (65 FE) MG tablet  Take 1 tablet (325 mg total) by mouth 3 (three) times daily after meals.     HYDROcodone-acetaminophen 7.5-325 MG per tablet  Commonly known as:  NORCO  Take 1-2 tablets by mouth every 4 (four) hours as needed for moderate pain.     lisinopril-hydrochlorothiazide 20-12.5 MG per tablet  Commonly known as:  PRINZIDE,ZESTORETIC  Take 1 tablet by mouth daily.     methocarbamol 500 MG tablet  Commonly known as:  ROBAXIN  Take 1 tablet (500 mg total) by  mouth every 6 (six) hours as needed for muscle spasms.     nicotine 14 mg/24hr patch  Commonly known as:  NICODERM CQ - dosed in mg/24 hours  Place 1 patch onto the skin daily as needed (patient uses it now and then).     polyethylene glycol packet  Commonly known as:  MIRALAX / GLYCOLAX  Take 17 g by mouth daily as needed for mild constipation.         Signed: [name]. Justice Aguirre  PAC  03/29/2013, 5:36 PM

## 2013-03-29 NOTE — Care Management Note (Signed)
    Page 1 of 2   03/29/2013     1:40:58 PM   CARE MANAGEMENT NOTE 03/29/2013  Patient:  Luis Ryan,Luis Ryan   Account Number:  [id number]  Date Initiated:  03/29/2013  Documentation initiated by:  Colleen Can  Subjective/Objective Assessment:   dx right hip osteoarthritis; total hip replacemnt-anterior approach -right.    HH services to start within 48hrs of discharge.     Action/Plan:   CM spoke with patient. Patient plans to return to her home in [place] where spouse and mother-in-law will be caregivers. He will need RW and 3n1. States [facility] Norlander will provide HHpt services   Anticipated DC Date:  03/29/2013   Anticipated DC Plan:  HOME W HOME HEALTH SERVICES      DC Planning Services  CM consult      PAC Choice  DURABLE MEDICAL EQUIPMENT  HOME HEALTH   Choice offered to / List presented to:  C-1 Patient   DME arranged  3-N-1  Levan Hurst      DME agency  [facility]     HH arranged  HH-2 PT      [place] agency  [facility]   Status of service:  Completed, signed off Medicare Important Message given?  NA - LOS <3 / Initial given by admissions (If response is "NO", the following Medicare IM given date fields will be blank) Date Medicare IM given:   Date Additional Medicare IM given:    Discharge Disposition:  HOME W HOME HEALTH SERVICES  Per UR Regulation:  Reviewed for med. necessity/level of care/duration of stay  If discussed at Long Length of Stay Meetings, dates discussed:    Comments:

## 2013-03-29 NOTE — Progress Notes (Signed)
   Subjective: 1 Day Post-Op Procedure(s) (LRB): RIGHT TOTAL HIP ARTHROPLASTY ANTERIOR APPROACH (Right)   Patient reports pain as mild, pain well controlled. No events throughout the night. Ready to be discharged home if he does well with PT and pain stays well controlled.   Objective:   VITALS:   Filed Vitals:   03/29/13   BP: 145/85  Pulse: 83  Temp: 98.3 F (36.8 C)  Resp: 16    Neurovascular intact Dorsiflexion/Plantar flexion intact Incision: dressing C/D/I No cellulitis present Compartment soft  LABS  Recent Labs  03/29/13 0439  HGB 12.0*  HCT 36.0*  WBC 13.8*  PLT 195     Recent Labs  03/29/13 0439  NA 137  K 4.1  BUN 18  CREATININE 0.88  GLUCOSE 119*     Assessment/Plan: 1 Day Post-Op Procedure(s) (LRB): RIGHT TOTAL HIP ARTHROPLASTY ANTERIOR APPROACH (Right) Foley cath d/c'ed Advance diet Up with therapy D/C IV fluids Discharge home with home health Follow up in 2 weeks at [facility]. Follow up with OLIN,Loreley Schwall D in 2 weeks.  Contact information:  [facility] [address] [place] [address] [phone]    Expected ABLA  Treated with iron and will observe  Morbid Obesity (BMI >40)  Estimated body mass index is 41.02 kg/(m^2) as calculated from the following:   Height as of this encounter: 5\' 6"  (1.676 m).   Weight as of this encounter: 115.214 kg (254 lb). Patient also counseled that weight may inhibit the healing process Patient counseled that losing weight will help with future health issues     Luis Ryan. Luis Ryan   PAC  03/29/2013, 7:41 AM

## 2013-04-27 ENCOUNTER — Telehealth: Payer: Self-pay | Admitting: Family Medicine

## 2013-04-27 NOTE — Telephone Encounter (Signed)
Pt requesting refill of tramadol 50 mg tabs, pt notified pcp is out of office until next week.  Please call when available for pick up.

## 2013-04-28 NOTE — Telephone Encounter (Signed)
Last visit 03/08/13 Last refilll 06/28/12 #180 1 refill  Pt has taken the medication two different ways: Take 1 tablet by mouth 2 times daily and Take 1 tablet by mouth every 6 hours as needed.

## 2013-04-30 NOTE — Telephone Encounter (Signed)
Refill OK.  He can continue to take by either regimen he described.

## 2013-05-01 MED ORDER — TRAMADOL HCL 50 MG PO TABS: ORAL_TABLET | ORAL | Status: DC

## 2013-05-01 NOTE — Telephone Encounter (Signed)
Pt is aware that RX is ready for pick up. 

## 2013-09-19 ENCOUNTER — Other Ambulatory Visit: Payer: Self-pay | Admitting: Family Medicine

## 2013-09-20 NOTE — Telephone Encounter (Signed)
Last visit 03/08/13 Last refill 05/01/13 #180 0 refill

## 2013-09-20 NOTE — Telephone Encounter (Signed)
Refill OK

## 2013-12-26 ENCOUNTER — Other Ambulatory Visit: Payer: Self-pay | Admitting: Family Medicine

## 2014-01-11 ENCOUNTER — Other Ambulatory Visit: Payer: Self-pay | Admitting: Family Medicine

## 2014-03-28 ENCOUNTER — Telehealth: Payer: Self-pay | Admitting: Family Medicine

## 2014-03-28 NOTE — Telephone Encounter (Signed)
Can you please set patient with a physical exam if he wants one or a follow up on medication. Patient has not been seen since 02/2013

## 2014-03-28 NOTE — Telephone Encounter (Signed)
OK to refill until follow up.

## 2014-03-28 NOTE — Telephone Encounter (Signed)
Please add lisinopril-hydrochlorothiazide (PRINZIDE,ZESTORETIC) 20-12.5 MG per tablet to the below re-fill request

## 2014-04-02 MED ORDER — LISINOPRIL-HYDROCHLOROTHIAZIDE 20-12.5 MG PO TABS: 1.0000 | ORAL_TABLET | Freq: Every day | ORAL | Status: DC

## 2014-04-02 NOTE — Addendum Note (Signed)
Addended by: Marcina Millard on: 04/02/2014 04:45 PM   Modules accepted: Orders

## 2014-04-02 NOTE — Telephone Encounter (Signed)
LMOM for pt to call the office and schedule appt

## 2014-04-04 ENCOUNTER — Other Ambulatory Visit (INDEPENDENT_AMBULATORY_CARE_PROVIDER_SITE_OTHER): Payer: PRIVATE HEALTH INSURANCE

## 2014-04-04 DIAGNOSIS — Z0000 Encounter for general adult medical examination without abnormal findings: Secondary | ICD-10-CM

## 2014-04-04 LAB — POCT URINALYSIS DIPSTICK
Bilirubin, UA: NEGATIVE
Glucose, UA: NEGATIVE
KETONES UA: NEGATIVE
Leukocytes, UA: NEGATIVE
Nitrite, UA: NEGATIVE
PROTEIN UA: NEGATIVE
RBC UA: NEGATIVE
Spec Grav, UA: 1.02
UROBILINOGEN UA: 0.2
pH, UA: 6

## 2014-04-04 LAB — BASIC METABOLIC PANEL
BUN: 24 mg/dL — ABNORMAL HIGH (ref 6–23)
CALCIUM: 9.4 mg/dL (ref 8.4–10.5)
CO2: 27 mEq/L (ref 19–32)
Chloride: 104 mEq/L (ref 96–112)
Creatinine, Ser: 1.1 mg/dL (ref 0.4–1.5)
GFR: 78.18 mL/min (ref >60.00)
Glucose, Bld: 97 mg/dL (ref 70–99)
Potassium: 4.6 mEq/L (ref 3.5–5.1)
SODIUM: 140 meq/L (ref 135–145)

## 2014-04-04 LAB — LIPID PANEL
CHOLESTEROL: 197 mg/dL (ref 0–200)
HDL: 43.3 mg/dL (ref >39.00)
LDL Cholesterol: 143 mg/dL — ABNORMAL HIGH (ref 0–99)
NONHDL: 153.7
Total CHOL/HDL Ratio: 5
Triglycerides: 56 mg/dL (ref 0.0–149.0)
VLDL: 11.2 mg/dL (ref 0.0–40.0)

## 2014-04-04 LAB — CBC WITH DIFFERENTIAL/PLATELET
Basophils Absolute: 0 10*3/uL (ref 0.0–0.1)
Basophils Relative: 0.4 % (ref 0.0–3.0)
EOS ABS: 0.1 10*3/uL (ref 0.0–0.7)
Eosinophils Relative: 1.5 % (ref 0.0–5.0)
HEMATOCRIT: 43.5 % (ref 39.0–52.0)
Hemoglobin: 14.4 g/dL (ref 13.0–17.0)
LYMPHS ABS: 1.8 10*3/uL (ref 0.7–4.0)
Lymphocytes Relative: 21.7 % (ref 12.0–46.0)
MCHC: 33 g/dL (ref 30.0–36.0)
MCV: 90.1 fl (ref 78.0–100.0)
Monocytes Absolute: 0.4 10*3/uL (ref 0.1–1.0)
Monocytes Relative: 4.5 % (ref 3.0–12.0)
NEUTROS PCT: 71.9 % (ref 43.0–77.0)
Neutro Abs: 5.9 10*3/uL (ref 1.4–7.7)
PLATELETS: 198 10*3/uL (ref 150.0–400.0)
RBC: 4.83 Mil/uL (ref 4.22–5.81)
RDW: 14.3 % (ref 11.5–15.5)
WBC: 8.2 10*3/uL (ref 4.0–10.5)

## 2014-04-04 LAB — HEPATIC FUNCTION PANEL
ALT: 29 U/L (ref 0–53)
AST: 20 U/L (ref 0–37)
Albumin: 3.7 g/dL (ref 3.5–5.2)
Alkaline Phosphatase: 82 U/L (ref 39–117)
BILIRUBIN TOTAL: 0.4 mg/dL (ref 0.2–1.2)
Bilirubin, Direct: 0.1 mg/dL (ref 0.0–0.3)
Total Protein: 7 g/dL (ref 6.0–8.3)

## 2014-04-04 LAB — TSH: TSH: 1.43 u[IU]/mL (ref 0.35–4.50)

## 2014-04-04 LAB — PSA: PSA: 0.25 ng/mL (ref 0.10–4.00)

## 2014-04-10 ENCOUNTER — Encounter: Payer: Self-pay | Admitting: Family Medicine

## 2014-04-10 ENCOUNTER — Ambulatory Visit (INDEPENDENT_AMBULATORY_CARE_PROVIDER_SITE_OTHER): Payer: PRIVATE HEALTH INSURANCE | Admitting: Family Medicine

## 2014-04-10 VITALS — BP 110/68 | HR 66 | Temp 98.4°F | Ht 66.0 in | Wt 249.0 lb

## 2014-04-10 DIAGNOSIS — Z0000 Encounter for general adult medical examination without abnormal findings: Secondary | ICD-10-CM

## 2014-04-10 MED ORDER — TRAMADOL HCL 50 MG PO TABS: 50.0000 mg | ORAL_TABLET | Freq: Two times a day (BID) | ORAL | Status: DC

## 2014-04-10 NOTE — Progress Notes (Signed)
Subjective:    Patient ID: Luis Ryan, male    DOB: 05-Sep-1960, 53 y.o.   MRN: [id number]  HPI Here for complete physical. His chronic problems include obesity, osteoarthritis multiple joints, hypertension, mild dyslipidemia, GERD. He had right total hip replacement last year and has done extremely well since then. He quit smoking one week ago. Both he and his wife quit together. No chest pains. Colonoscopy up-to-date. Immunizations up-to-date. He hopes to start weight loss program soon  Past Medical History  Diagnosis Date  . HYPERLIPIDEMIA 07/10/2009    hx of   . HYPERTENSION 07/10/2009  . GERD 07/10/2009  . 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 HIP PAIN, BILATERAL 09/10/2009  . [place] 07/10/2009  . NECK MASS 05/29/2010    "watching it"  . Allergy to trees   . Seasonal allergies    Past Surgical History  Procedure Laterality Date  . Knee surgery Right 2009    scope and partial replacement  . 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  He has a 15 pack-year smoking history. He has never used smokeless tobacco. He reports that he drinks alcohol. He reports that he does not use illicit drugs. family history includes COPD in his father; Cancer in his mother; Hyperlipidemia in his other; Hypertension in his mother. No Known Allergies    Review of Systems  Constitutional: Negative for fever, activity change, appetite change and fatigue.  HENT: Negative for congestion, ear pain and trouble swallowing.   Eyes: Negative for pain and visual disturbance.  Respiratory: Negative for cough, shortness of breath and wheezing.   Cardiovascular: Negative for chest pain and palpitations.  Gastrointestinal:  Negative for nausea, vomiting, abdominal pain, diarrhea, constipation, blood in stool, abdominal distention and rectal pain.  Endocrine: Negative for polydipsia and polyuria.  Genitourinary: Negative for dysuria, hematuria and testicular pain.  Musculoskeletal: Negative for joint swelling and arthralgias.  Skin: Negative for rash.  Neurological: Negative for dizziness, syncope and headaches.  Hematological: Negative for adenopathy.  Psychiatric/Behavioral: Negative for confusion and dysphoric mood.       Objective:   Physical Exam  Constitutional: He is oriented to person, place, and time. He appears well-developed and well-nourished. No distress.  HENT:  Head: Normocephalic and atraumatic.  Right Ear: External ear normal.  Left Ear: External ear normal.  Mouth/Throat: Oropharynx is clear and moist.  Eyes: Conjunctivae and EOM are normal. Pupils are equal, round, and reactive to light.  Neck: Normal range of motion. Neck supple. No thyromegaly present.  Cardiovascular: Normal rate, regular rhythm and normal heart sounds.   No murmur heard. Pulmonary/Chest: No respiratory distress. He has no wheezes. He has no rales.  Abdominal: Soft. Bowel sounds are normal. He exhibits no distension and no mass. There is no tenderness. There is no rebound and no guarding.  Musculoskeletal: He exhibits no edema.  Lymphadenopathy:    He has no cervical adenopathy.  Neurological: He is alert and oriented to person, place, and time. He displays normal reflexes. No cranial nerve deficit.  Skin: No rash noted.  Psychiatric: He has a normal mood and affect.          Assessment & Plan:  Complete physical. Labs  reviewed. His PSA remains stable. Lipids are minimally elevated. He has 5% 10 year risk of CAD event. Congratulated on quitting smoking. We discussed maintenance of smoking cessation. Recommend losing some weight.

## 2014-04-10 NOTE — Progress Notes (Signed)
Pre visit review using our clinic review tool, if applicable. No additional management support is needed unless otherwise documented below in the visit note. 

## 2014-04-11 ENCOUNTER — Telehealth: Payer: Self-pay | Admitting: Family Medicine

## 2014-04-11 NOTE — Telephone Encounter (Signed)
emmi emailed °

## 2014-05-04 ENCOUNTER — Telehealth: Payer: Self-pay

## 2014-05-04 NOTE — Telephone Encounter (Signed)
Tramadol  Last visit 04/10/14 Last refill 04/10/14 #180 0 refill

## 2014-05-04 NOTE — Telephone Encounter (Signed)
Rx request for:  Lisinopril/HCTZ 20/12.5- Take 1 tablet daily #30  Tramadol 50 g tablet- Take 1 tablet bid. #180  Pharm:  [place]  Please advise.

## 2014-05-06 NOTE — Telephone Encounter (Signed)
Refill Lisinopril for one year and OK to refill Tramadol

## 2014-05-07 MED ORDER — LISINOPRIL-HYDROCHLOROTHIAZIDE 20-12.5 MG PO TABS: 1.0000 | ORAL_TABLET | Freq: Every day | ORAL | Status: DC

## 2014-05-07 MED ORDER — TRAMADOL HCL 50 MG PO TABS: 50.0000 mg | ORAL_TABLET | Freq: Two times a day (BID) | ORAL | Status: DC

## 2014-05-07 NOTE — Telephone Encounter (Signed)
Faxed to [facility].

## 2014-12-05 ENCOUNTER — Other Ambulatory Visit: Payer: Self-pay | Admitting: *Deleted

## 2014-12-05 NOTE — Telephone Encounter (Signed)
Last visit 04/10/14 Last refill 05/07/14 #180 0 refill

## 2014-12-05 NOTE — Telephone Encounter (Signed)
Refill OK

## 2014-12-06 MED ORDER — TRAMADOL HCL 50 MG PO TABS: 50.0000 mg | ORAL_TABLET | Freq: Two times a day (BID) | ORAL | Status: DC

## 2015-03-27 ENCOUNTER — Other Ambulatory Visit: Payer: Self-pay | Admitting: Family Medicine

## 2015-03-27 NOTE — Telephone Encounter (Signed)
Pt request refill of the following: traMADol (ULTRAM) 50 MG tablet   Phamacy: [place]

## 2015-03-27 NOTE — Telephone Encounter (Signed)
ok 

## 2015-03-27 NOTE — Telephone Encounter (Signed)
Patient requesting refill on Tramadol (Ultram) 50 mg. Dr. Elease Hashimoto out of office. Was not sure since has been exactly 1 year if refill was appropriate or needing appointment.   Last seen: 04/10/14 Last refill: 12/06/14

## 2015-03-28 ENCOUNTER — Telehealth: Payer: Self-pay | Admitting: *Deleted

## 2015-03-28 ENCOUNTER — Other Ambulatory Visit: Payer: Self-pay

## 2015-03-28 MED ORDER — TRAMADOL HCL 50 MG PO TABS: 50.0000 mg | ORAL_TABLET | Freq: Two times a day (BID) | ORAL | Status: DC

## 2015-03-28 NOTE — Telephone Encounter (Signed)
Patient is requesting a refill of Tramadol 50 mg, take one half by mouth twice daily. Last filled 12/06/14 Last seen on 04/10/14. [place], Lady Gary  Okay to fill?

## 2015-03-28 NOTE — Telephone Encounter (Signed)
Verbally called in for patient.  

## 2015-04-12 ENCOUNTER — Other Ambulatory Visit: Payer: PRIVATE HEALTH INSURANCE

## 2015-04-16 ENCOUNTER — Other Ambulatory Visit (INDEPENDENT_AMBULATORY_CARE_PROVIDER_SITE_OTHER): Payer: PRIVATE HEALTH INSURANCE

## 2015-04-16 DIAGNOSIS — Z0000 Encounter for general adult medical examination without abnormal findings: Secondary | ICD-10-CM | POA: Diagnosis not present

## 2015-04-16 LAB — CBC WITH DIFFERENTIAL/PLATELET
Basophils Absolute: 0 K/uL (ref 0.0–0.1)
Basophils Relative: 0.2 % (ref 0.0–3.0)
Eosinophils Absolute: 0.2 K/uL (ref 0.0–0.7)
Eosinophils Relative: 2.5 % (ref 0.0–5.0)
HCT: 41.1 % (ref 39.0–52.0)
Hemoglobin: 13.9 g/dL (ref 13.0–17.0)
Lymphocytes Relative: 21.4 % (ref 12.0–46.0)
Lymphs Abs: 1.7 K/uL (ref 0.7–4.0)
MCHC: 33.9 g/dL (ref 30.0–36.0)
MCV: 89.7 fl (ref 78.0–100.0)
Monocytes Absolute: 0.5 K/uL (ref 0.1–1.0)
Monocytes Relative: 5.8 % (ref 3.0–12.0)
Neutro Abs: 5.7 K/uL (ref 1.4–7.7)
Neutrophils Relative %: 70.1 % (ref 43.0–77.0)
Platelets: 193 K/uL (ref 150.0–400.0)
RBC: 4.58 Mil/uL (ref 4.22–5.81)
RDW: 14.3 % (ref 11.5–15.5)
WBC: 8.2 K/uL (ref 4.0–10.5)

## 2015-04-16 LAB — BASIC METABOLIC PANEL
BUN: 21 mg/dL (ref 6–23)
CHLORIDE: 104 meq/L (ref 96–112)
CO2: 31 meq/L (ref 19–32)
CREATININE: 0.93 mg/dL (ref 0.40–1.50)
Calcium: 9.8 mg/dL (ref 8.4–10.5)
GFR: 89.59 mL/min (ref >60.00)
GLUCOSE: 90 mg/dL (ref 70–99)
Potassium: 4.4 mEq/L (ref 3.5–5.1)
Sodium: 143 mEq/L (ref 135–145)

## 2015-04-16 LAB — HEPATIC FUNCTION PANEL
ALBUMIN: 4.3 g/dL (ref 3.5–5.2)
ALK PHOS: 80 U/L (ref 39–117)
ALT: 38 U/L (ref 0–53)
AST: 21 U/L (ref 0–37)
Bilirubin, Direct: 0.1 mg/dL (ref 0.0–0.3)
TOTAL PROTEIN: 6.9 g/dL (ref 6.0–8.3)
Total Bilirubin: 0.5 mg/dL (ref 0.2–1.2)

## 2015-04-16 LAB — LIPID PANEL
Cholesterol: 199 mg/dL (ref 0–200)
HDL: 51.1 mg/dL
LDL Cholesterol: 133 mg/dL — ABNORMAL HIGH (ref 0–99)
NonHDL: 148.31
Total CHOL/HDL Ratio: 4
Triglycerides: 79 mg/dL (ref 0.0–149.0)
VLDL: 15.8 mg/dL (ref 0.0–40.0)

## 2015-04-16 LAB — PSA: PSA: 0.26 ng/mL (ref 0.10–4.00)

## 2015-04-16 LAB — TSH: TSH: 2.4 u[IU]/mL (ref 0.35–4.50)

## 2015-04-17 ENCOUNTER — Encounter: Payer: Self-pay | Admitting: Family Medicine

## 2015-04-17 ENCOUNTER — Ambulatory Visit (INDEPENDENT_AMBULATORY_CARE_PROVIDER_SITE_OTHER): Payer: PRIVATE HEALTH INSURANCE | Admitting: Family Medicine

## 2015-04-17 VITALS — BP 120/78 | HR 81 | Temp 98.4°F | Resp 16 | Ht 66.0 in | Wt 247.0 lb

## 2015-04-17 DIAGNOSIS — Z0000 Encounter for general adult medical examination without abnormal findings: Secondary | ICD-10-CM

## 2015-04-17 NOTE — Progress Notes (Signed)
Pre visit review using our clinic review tool, if applicable. No additional management support is needed unless otherwise documented below in the visit note. 

## 2015-04-17 NOTE — Patient Instructions (Signed)
Smoking Cessation, Tips for Success If you are ready to quit smoking, congratulations! You have chosen to help yourself be healthier. Cigarettes bring nicotine, tar, carbon monoxide, and other irritants into your body. Your lungs, heart, and blood vessels will be able to work better without these poisons. There are many different ways to quit smoking. Nicotine gum, nicotine patches, a nicotine inhaler, or nicotine nasal spray can help with physical craving. Hypnosis, support groups, and medicines help break the habit of smoking. WHAT THINGS CAN I DO TO MAKE QUITTING EASIER?  Here are some tips to help you quit for good:  Pick a date when you will quit smoking completely. Tell all of your friends and family about your plan to quit on that date.  Do not try to slowly cut down on the number of cigarettes you are smoking. Pick a quit date and quit smoking completely starting on that day.  Throw away all cigarettes.   Clean and remove all ashtrays from your home, work, and car.  On a card, write down your reasons for quitting. Carry the card with you and read it when you get the urge to smoke.  Cleanse your body of nicotine. Drink enough water and fluids to keep your urine clear or pale yellow. Do this after quitting to flush the nicotine from your body.  Learn to predict your moods. Do not let a bad situation be your excuse to have a cigarette. Some situations in your life might tempt you into wanting a cigarette.  Never have "just one" cigarette. It leads to wanting another and another. Remind yourself of your decision to quit.  Change habits associated with smoking. If you smoked while driving or when feeling stressed, try other activities to replace smoking. Stand up when drinking your coffee. Brush your teeth after eating. Sit in a different chair when you read the paper. Avoid alcohol while trying to quit, and try to drink fewer caffeinated beverages. Alcohol and caffeine may urge you to  smoke.  Avoid foods and drinks that can trigger a desire to smoke, such as sugary or spicy foods and alcohol.  Ask people who smoke not to smoke around you.  Have something planned to do right after eating or having a cup of coffee. For example, plan to take a walk or exercise.  Try a relaxation exercise to calm you down and decrease your stress. Remember, you may be tense and nervous for the first 2 weeks after you quit, but this will pass.  Find new activities to keep your hands busy. Play with a pen, coin, or rubber band. Doodle or draw things on paper.  Brush your teeth right after eating. This will help cut down on the craving for the taste of tobacco after meals. You can also try mouthwash.   Use oral substitutes in place of cigarettes. Try using lemon drops, carrots, cinnamon sticks, or chewing gum. Keep them handy so they are available when you have the urge to smoke.  When you have the urge to smoke, try deep breathing.  Designate your home as a nonsmoking area.  If you are a heavy smoker, ask your health care provider about a prescription for nicotine chewing gum. It can ease your withdrawal from nicotine.  Reward yourself. Set aside the cigarette money you save and buy yourself something nice.  Look for support from others. Join a support group or smoking cessation program. Ask someone at home or at work to help you with your plan   to quit smoking.  Always ask yourself, "Do I need this cigarette or is this just a reflex?" Tell yourself, "Today, I choose not to smoke," or "I do not want to smoke." You are reminding yourself of your decision to quit.  Do not replace cigarette smoking with electronic cigarettes (commonly called e-cigarettes). The safety of e-cigarettes is unknown, and some may contain harmful chemicals.  If you relapse, do not give up! Plan ahead and think about what you will do the next time you get the urge to smoke. HOW WILL I FEEL WHEN I QUIT SMOKING? You  may have symptoms of withdrawal because your body is used to nicotine (the addictive substance in cigarettes). You may crave cigarettes, be irritable, feel very hungry, cough often, get headaches, or have difficulty concentrating. The withdrawal symptoms are only temporary. They are strongest when you first quit but will go away within 10-14 days. When withdrawal symptoms occur, stay in control. Think about your reasons for quitting. Remind yourself that these are signs that your body is healing and getting used to being without cigarettes. Remember that withdrawal symptoms are easier to treat than the major diseases that smoking can cause.  Even after the withdrawal is over, expect periodic urges to smoke. However, these cravings are generally short lived and will go away whether you smoke or not. Do not smoke! WHAT RESOURCES ARE AVAILABLE TO HELP ME QUIT SMOKING? Your health care provider can direct you to community resources or hospitals for support, which may include:  Group support.  Education.  Hypnosis.  Therapy.   This information is not intended to replace advice given to you by your health care provider. Make sure you discuss any questions you have with your health care provider.   Document Released: 02/07/2004 Document Revised: 06/01/2014 Document Reviewed: 10/27/2012 Elsevier Interactive Patient Education 2016 [place] will be due for repeat colonoscopy by February of 2017.

## 2015-04-17 NOTE — Progress Notes (Signed)
Subjective:    Patient ID: Luis Ryan, male    DOB: [birth year], 54 y.o.   MRN: [id number]  HPI Patient seen for complete physical. His chronic problems include history of obesity, osteoarthritis, GERD. He's had prior hip replacement and partial knee replacement. He is limited somewhat in activities because of ongoing knee pains. He takes diclofenac and tramadol for osteoarthritis pains. He's had prior history of adenomatous polyps and is due for repeat colonoscopy in February 2017. Still smoking about half pack cigarettes per day. Hopes to quit soon. No consistent exercise.  Past Medical History  Diagnosis Date  . HYPERLIPIDEMIA 07/10/2009    hx of   . HYPERTENSION 07/10/2009  . GERD 07/10/2009  . 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 HIP PAIN, BILATERAL 09/10/2009  . [place] 07/10/2009  . NECK MASS 05/29/2010    "watching it"  . Allergy to trees   . Seasonal allergies    Past Surgical History  Procedure Laterality Date  . Knee surgery Right 2009    scope and partial replacement  . 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  He has a 15 pack-year smoking history. He has never used smokeless tobacco. He reports that he drinks alcohol. He reports that he does not use illicit drugs. family history includes COPD in his father; Cancer in his mother; Hyperlipidemia in his other; Hypertension in his mother. No Known Allergies    Review of Systems  Constitutional: Negative for fever, activity change, appetite change and fatigue.  HENT: Negative for congestion, ear pain and trouble swallowing.   Eyes: Negative for pain and visual disturbance.  Respiratory: Negative for cough, shortness of breath and  wheezing.   Cardiovascular: Negative for chest pain and palpitations.  Gastrointestinal: Negative for nausea, vomiting, abdominal pain, diarrhea, constipation, blood in stool, abdominal distention and rectal pain.  Genitourinary: Negative for dysuria, hematuria and testicular pain.  Musculoskeletal: Positive for arthralgias. Negative for joint swelling.  Skin: Negative for rash.  Neurological: Negative for dizziness, syncope and headaches.  Hematological: Negative for adenopathy.  Psychiatric/Behavioral: Negative for confusion and dysphoric mood.       Objective:   Physical Exam  Constitutional: He is oriented to person, place, and time. He appears well-developed and well-nourished. No distress.  HENT:  Head: Normocephalic and atraumatic.  Right Ear: External ear normal.  Left Ear: External ear normal.  Mouth/Throat: Oropharynx is clear and moist.  Eyes: Conjunctivae and EOM are normal. Pupils are equal, round, and reactive to light.  Neck: Normal range of motion. Neck supple. No thyromegaly present.  He has couple of nontender lipomas on the right side of the neck and these have previously been screened by CT scan in 2012 and are unchanged  Cardiovascular: Normal rate, regular rhythm and normal heart sounds.   No murmur heard. Pulmonary/Chest: No respiratory distress. He has no wheezes. He has no rales.  Abdominal: Soft. Bowel sounds are normal. He exhibits no distension and no mass. There is no tenderness. There is no rebound and no guarding.  Musculoskeletal: He exhibits no edema.  Lymphadenopathy:    He has no cervical adenopathy.  Neurological: He is alert and oriented to person, place, and time. He displays  normal reflexes. No cranial nerve deficit.  Skin: No rash noted.  Psychiatric: He has a normal mood and affect.          Assessment & Plan:  Complete physical. Labs reviewed. No major concerns. Strongly advised smoking cessation. Information from [place] quit  line given. Patient will contact GI regarding repeat colonoscopy which be due this coming year. He is strongly encouraged to lose some weight.

## 2015-05-02 IMAGING — CR DG CHEST 2V
2 series · 2 of 2 positions shown · non-contrast
Comparison: 04/09/2011

CLINICAL DATA: Preoperative evaluation for hip replacement

EXAM:
CHEST  2 VIEW

[w chest pa]
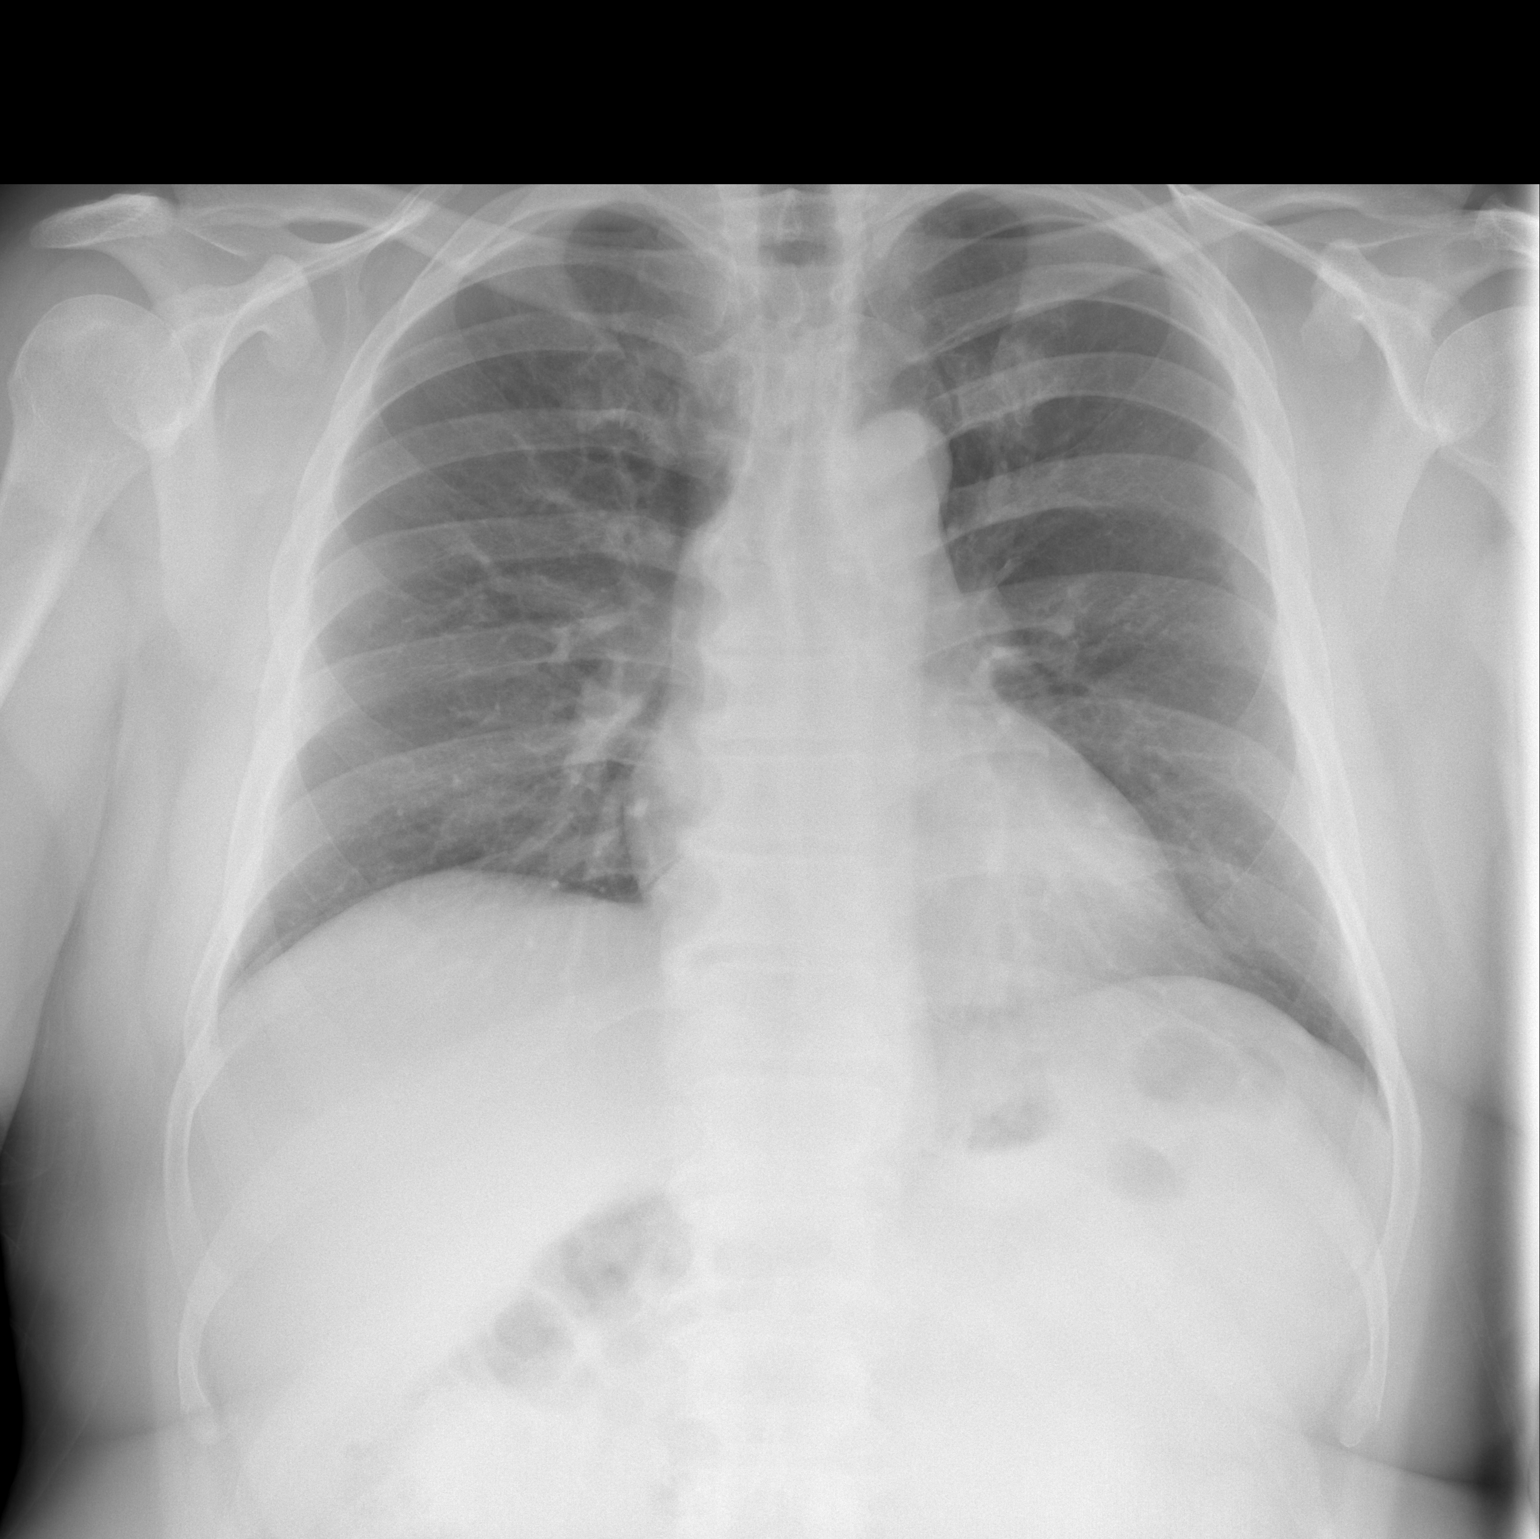

[w chest lat]
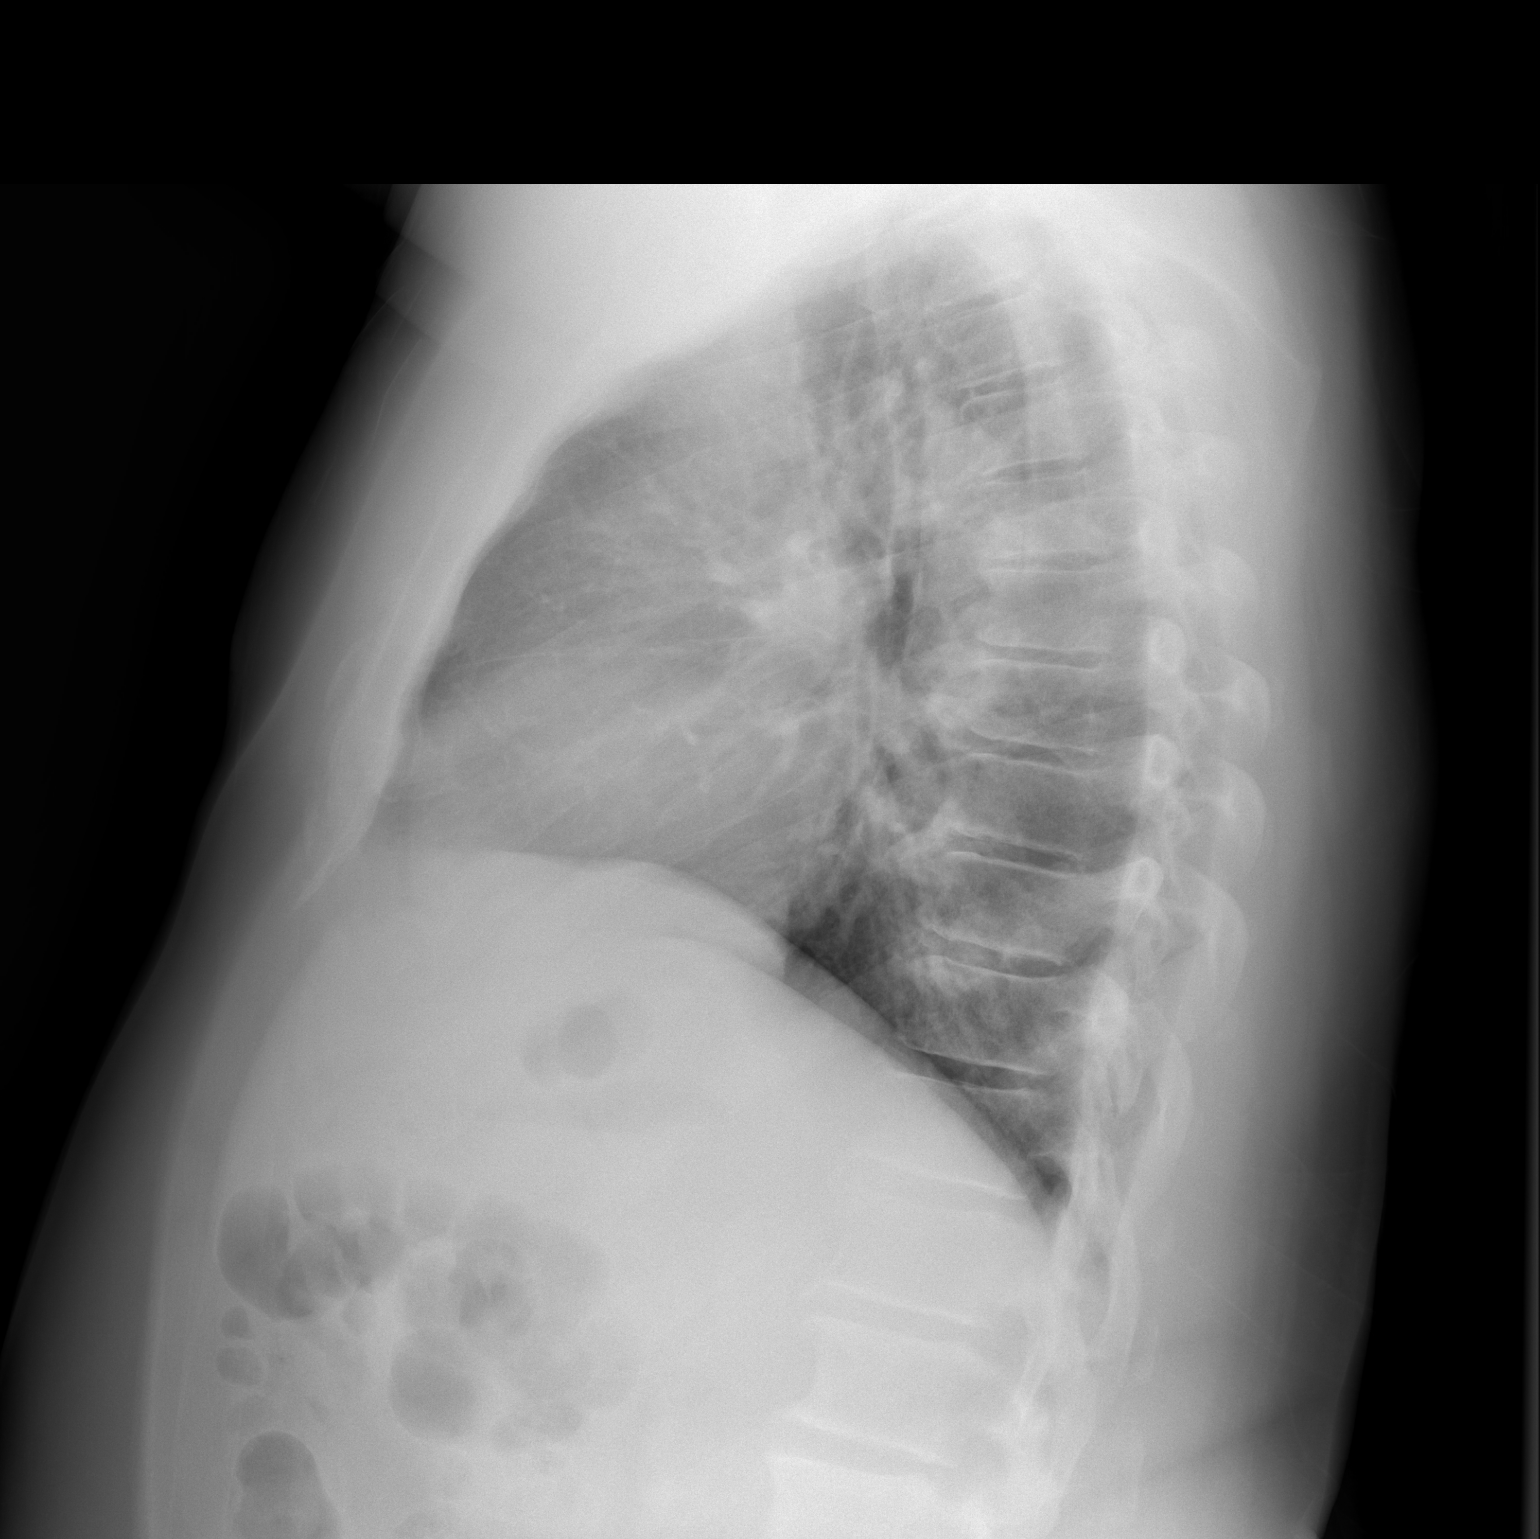

[2 of 2 positions shown; findings below may reference images not displayed]

FINDINGS: The cardiac shadow is within normal limits. The lungs are well
aerated bilaterally. Exuberant calcification is noted at the 1st
costochondral junction bilaterally stable from the prior exam. No
acute bony abnormality is seen.
IMPRESSION: No acute abnormality noted.

## 2015-05-09 IMAGING — CR DG HIP 1V PORT*R*
1 series · 1 of 1 positions shown · non-contrast
Comparison: Frontal view of the pelvis same day.

CLINICAL DATA: Post right hip replacement.

EXAM:
PORTABLE RIGHT HIP - 1 VIEW

[AP]
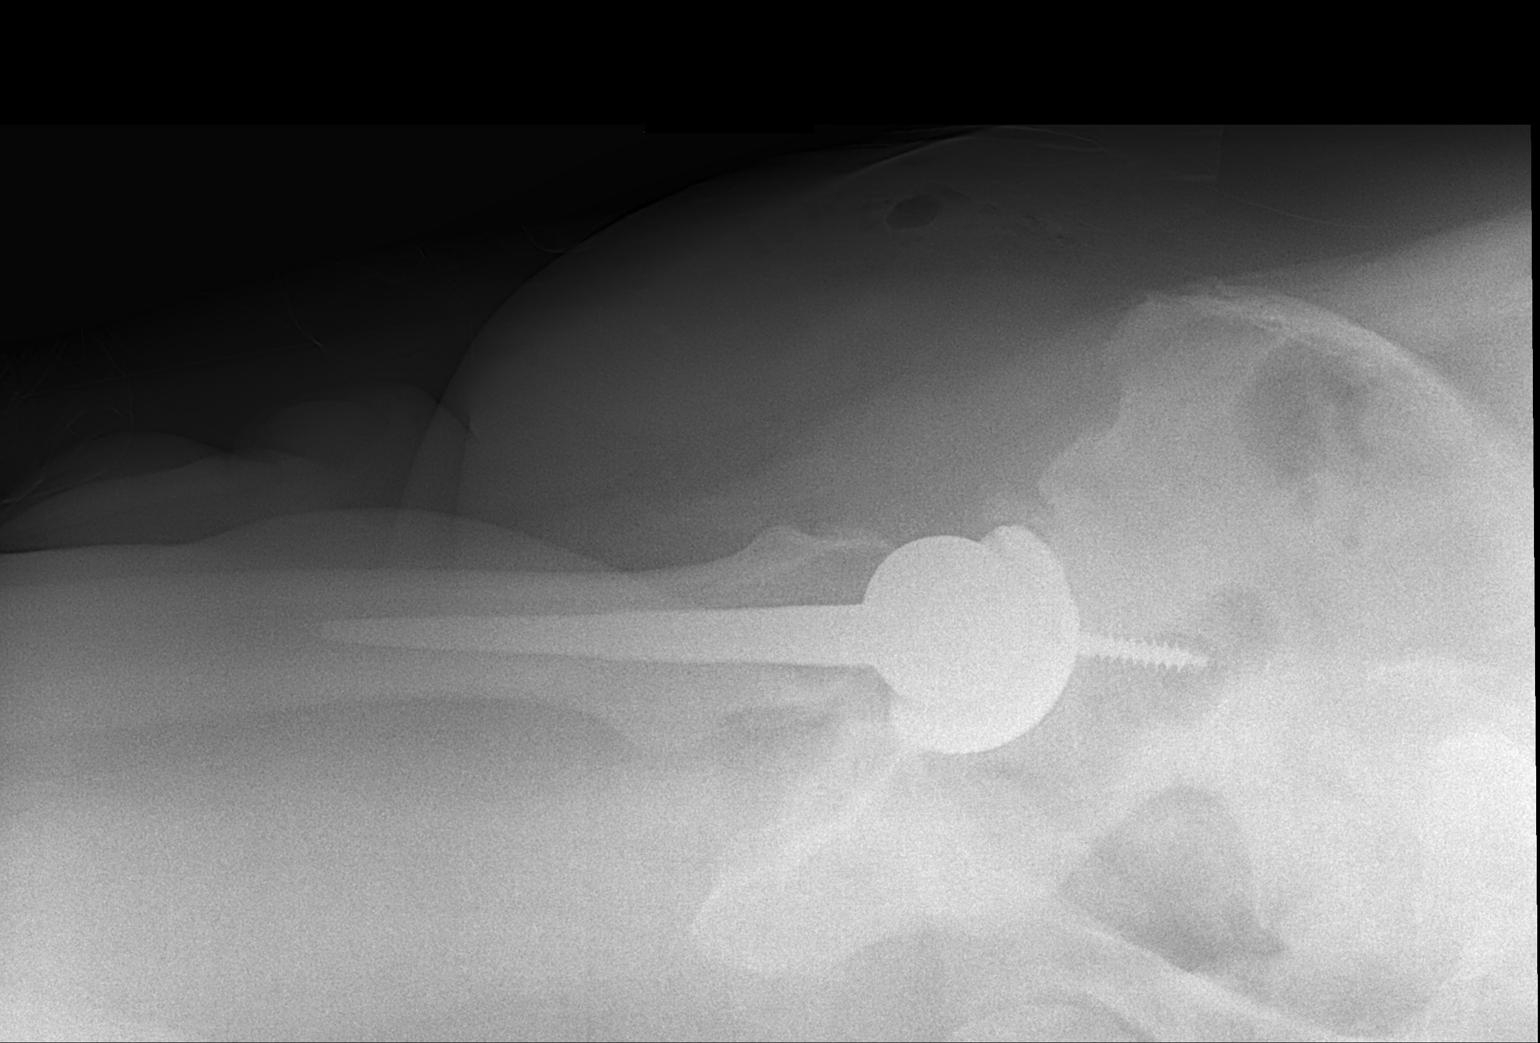

[1 of 1 positions shown; findings below may reference images not displayed]

FINDINGS: Single cross-table view of the right hip submitted. There is a right
hip prosthesis in anatomic alignment.
IMPRESSION: Right hip prosthesis in anatomic alignment.

## 2015-05-09 IMAGING — CR DG PORTABLE PELVIS
1 series · 1 of 1 positions shown · non-contrast
Comparison: 08/31/2009.

CLINICAL DATA: STATUS POST RIGHT HIP REPLACEMENT.

EXAM:
PORTABLE PELVIS 1-2 VIEWS

[AP]
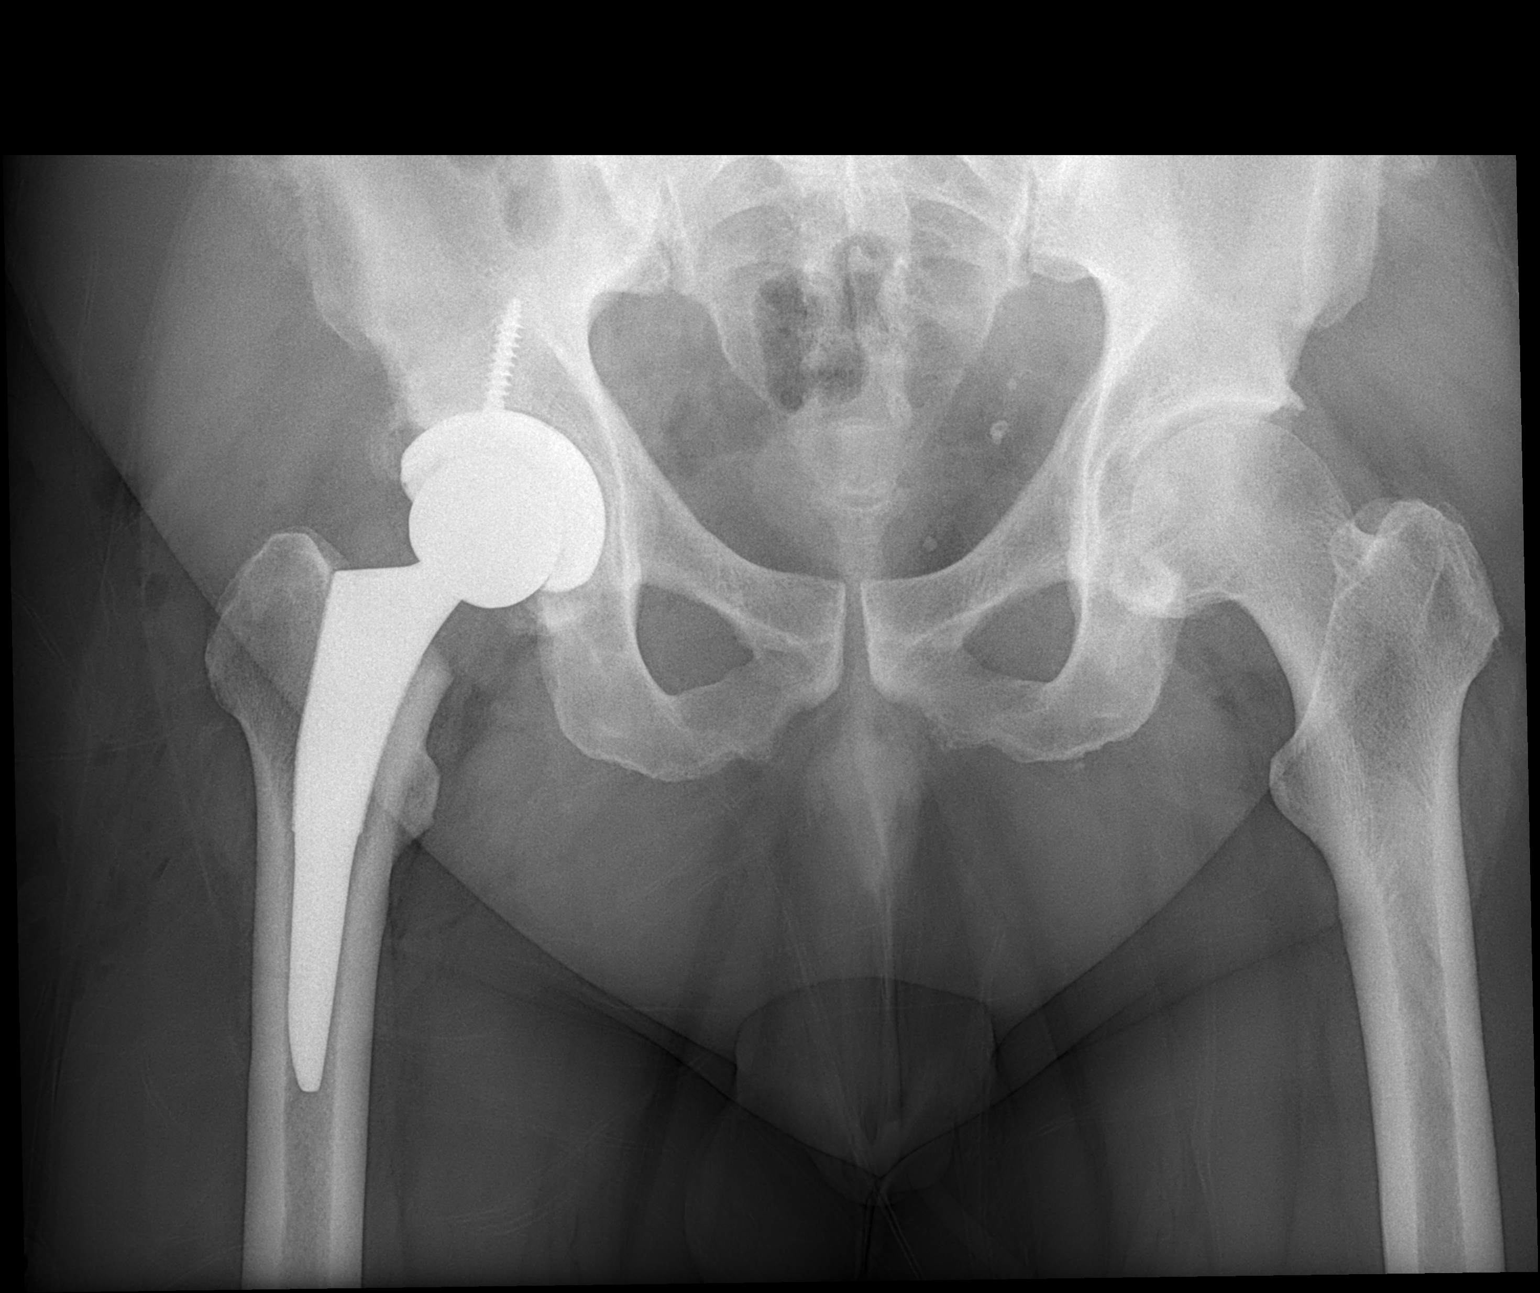

[1 of 1 positions shown; findings below may reference images not displayed]

FINDINGS: The femoral and acetabular components appear well seated without
evidence of loosening or failure. The osseous structures are
unremarkable. A
IMPRESSION: Patient status post total right hip replacement.

## 2015-05-13 ENCOUNTER — Other Ambulatory Visit: Payer: Self-pay | Admitting: Family Medicine

## 2015-05-13 MED ORDER — LISINOPRIL-HYDROCHLOROTHIAZIDE 20-12.5 MG PO TABS: 1.0000 | ORAL_TABLET | Freq: Every day | ORAL | Status: DC

## 2015-05-13 NOTE — Addendum Note (Signed)
Addended by: Miles Costain T on: 05/13/2015 12:51 PM   Modules accepted: Orders

## 2015-05-13 NOTE — Telephone Encounter (Signed)
Sent to the pharmacy by e-scribe.  Pt has cpx on 04/17/15.

## 2015-06-12 ENCOUNTER — Encounter: Payer: Self-pay | Admitting: Family Medicine

## 2015-06-12 ENCOUNTER — Ambulatory Visit (INDEPENDENT_AMBULATORY_CARE_PROVIDER_SITE_OTHER): Payer: PRIVATE HEALTH INSURANCE | Admitting: Family Medicine

## 2015-06-12 VITALS — BP 120/80 | HR 70 | Temp 98.8°F | Wt 255.0 lb

## 2015-06-12 DIAGNOSIS — L509 Urticaria, unspecified: Secondary | ICD-10-CM | POA: Diagnosis not present

## 2015-06-12 DIAGNOSIS — I10 Essential (primary) hypertension: Secondary | ICD-10-CM

## 2015-06-12 NOTE — Progress Notes (Signed)
   Subjective:    Patient ID: Luis Ryan, male    DOB: 05-May-1961, 56 y.o.   MRN: [id number]  HPI   Patient seen with history of recurrent hives for many years. This past Friday he developed some mild tongue swelling and his brother apparently had a recent problem with angioedema with ACE inhibitor and he at that point stopped the lisinopril. He is not monitoring blood pressure since then. He was taking some Zyrtec and H2-blocker and hives and tongue swelling promptly resolved. No other recent new medications. He's had previous allergy testing in the past with no food allergies. No dyspnea. No wheezing.  Past Medical History  Diagnosis Date  . HYPERLIPIDEMIA 07/10/2009    hx of   . HYPERTENSION 07/10/2009  . GERD 07/10/2009  . 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 HIP PAIN, BILATERAL 09/10/2009  . [place] 07/10/2009  . NECK MASS 05/29/2010    "watching it"  . Allergy to trees   . Seasonal allergies    Past Surgical History  Procedure Laterality Date  . Knee surgery Right 2009    scope and partial replacement  . 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  He has a 15 pack-year smoking history. He has never used smokeless tobacco. He reports that he drinks alcohol. He reports that he does not use illicit drugs. family history includes COPD in his father; Cancer in his mother; Hyperlipidemia in his other; Hypertension in his mother. Allergies  Allergen Reactions  . Lisinopril Hives      Review of Systems  Constitutional: Negative for fatigue.  Eyes: Negative for visual disturbance.  Respiratory: Negative for cough, chest tightness and shortness of breath.   Cardiovascular: Negative for chest pain,  palpitations and leg swelling.  Endocrine: Negative for polydipsia and polyuria.  Neurological: Negative for dizziness, syncope, weakness, light-headedness and headaches.       Objective:   Physical Exam  Constitutional: He is oriented to person, place, and time. He appears well-developed and well-nourished. No distress.  HENT:  Right Ear: External ear normal.  Left Ear: External ear normal.  Mouth/Throat: Oropharynx is clear and moist.  Eyes: Pupils are equal, round, and reactive to light.  Neck: Neck supple. No thyromegaly present.  Cardiovascular: Normal rate and regular rhythm.   Pulmonary/Chest: Effort normal and breath sounds normal. No respiratory distress. He has no wheezes. He has no rales.  Musculoskeletal: He exhibits no edema.  Neurological: He is alert and oriented to person, place, and time.          Assessment & Plan:   patient has history of hypertension and long history of recurrent hives but with recent episode of possible angioedema with some swelling of the scrotum and tongue. Those symptoms have fully resolved today. Discontinue lisinopril. Current blood pressure stable off medication. Continue to monitor. Be in touch if blood pressure consistently over 140/90. If so, we'll start amlodipine. Routine medical follow-up 2 months  would not use any ACE inhibitor or ARB in future for hypertension with episode above

## 2015-06-12 NOTE — Progress Notes (Signed)
Pre visit review using our clinic review tool, if applicable. No additional management support is needed unless otherwise documented below in the visit note. 

## 2015-06-12 NOTE — Patient Instructions (Addendum)
Monitor blood pressure and be in touch if consistently > 140/90 Schedule follow up colonoscopy with Dr Owens Loffler

## 2015-06-18 ENCOUNTER — Encounter: Payer: Self-pay | Admitting: Gastroenterology

## 2015-06-26 ENCOUNTER — Telehealth: Payer: Self-pay | Admitting: Family Medicine

## 2015-06-26 MED ORDER — AMLODIPINE BESYLATE 5 MG PO TABS: 5.0000 mg | ORAL_TABLET | Freq: Every day | ORAL | Status: DC

## 2015-06-26 NOTE — Telephone Encounter (Signed)
Spoke with patient and medication sent to pharmacy.

## 2015-06-26 NOTE — Telephone Encounter (Signed)
Spoke with pt. BPs have been elevated >140/90. He would like to start amlodipine asap. Please advise on dose.

## 2015-06-26 NOTE — Telephone Encounter (Signed)
Amlodipine 5 mg po qd #30 with 11 refills and office follow up one month to reassess.

## 2015-06-26 NOTE — Telephone Encounter (Signed)
Patient said the doctor wanted the patient to let him know when he thinks he needs to go on blood pressure medication.

## 2015-07-10 ENCOUNTER — Other Ambulatory Visit: Payer: Self-pay | Admitting: *Deleted

## 2015-07-10 MED ORDER — TRAMADOL HCL 50 MG PO TABS
50.0000 mg | ORAL_TABLET | Freq: Two times a day (BID) | ORAL | Status: DC
Start: 2015-07-10 — End: 2015-10-23

## 2015-07-17 ENCOUNTER — Ambulatory Visit (AMBULATORY_SURGERY_CENTER): Payer: Self-pay

## 2015-07-17 VITALS — Ht 66.0 in | Wt 253.8 lb

## 2015-07-17 DIAGNOSIS — Z860100 Personal history of colon polyps, unspecified: Secondary | ICD-10-CM

## 2015-07-17 MED ORDER — SUPREP BOWEL PREP KIT 17.5-3.13-1.6 GM/177ML PO SOLN: 1.0000 | Freq: Once | ORAL | Status: DC

## 2015-07-17 NOTE — Progress Notes (Signed)
No allergies to eggs or soy No past problems with anesthesia No home oxygen No diet/weight loss meds  Has email and internet; refused emmi 

## 2015-07-31 ENCOUNTER — Ambulatory Visit (AMBULATORY_SURGERY_CENTER): Payer: No Typology Code available for payment source | Admitting: Gastroenterology

## 2015-07-31 ENCOUNTER — Encounter: Payer: Self-pay | Admitting: Gastroenterology

## 2015-07-31 VITALS — BP 136/85 | HR 73 | Temp 98.6°F | Resp 16 | Ht 66.0 in | Wt 253.0 lb

## 2015-07-31 DIAGNOSIS — D122 Benign neoplasm of ascending colon: Secondary | ICD-10-CM

## 2015-07-31 DIAGNOSIS — Z86010 Personal history of colonic polyps: Secondary | ICD-10-CM

## 2015-07-31 DIAGNOSIS — K621 Rectal polyp: Secondary | ICD-10-CM | POA: Diagnosis not present

## 2015-07-31 DIAGNOSIS — D128 Benign neoplasm of rectum: Secondary | ICD-10-CM

## 2015-07-31 DIAGNOSIS — D129 Benign neoplasm of anus and anal canal: Secondary | ICD-10-CM

## 2015-07-31 DIAGNOSIS — Z1211 Encounter for screening for malignant neoplasm of colon: Secondary | ICD-10-CM

## 2015-07-31 MED ORDER — SODIUM CHLORIDE 0.9 % IV SOLN: 500.0000 mL | INTRAVENOUS | Status: DC

## 2015-07-31 NOTE — Op Note (Signed)
Patient Name: Luis Ryan Procedure Date: 07/31/2015 10:21 AM MRN: [id number] Endoscopist: Milus Banister , MD Age: 55 Referring MD:  Date of Birth: Dec 28, 1960 Gender: Male Procedure:            Colonoscopy Indications:          High risk colon cancer surveillance: Personal history                        of non-advanced adenoma (Colonoscopy 2012 Dr. Ardis Hughs,                        three small polyps, two were adenomas) Medicines:            Monitored Anesthesia Care Procedure:            Pre-Anesthesia Assessment:                       - Prior to the procedure, a History and Physical was                        performed, and patient medications and allergies were                        reviewed. The patient's tolerance of previous                        anesthesia was also reviewed. The risks and benefits of                        the procedure and the sedation options and risks were                        discussed with the patient. All questions were                        answered, and informed consent was obtained. Prior                        Anticoagulants: The patient has taken no previous                        anticoagulant or antiplatelet agents. ASA Grade                        Assessment: II - A patient with mild systemic disease.                        After reviewing the risks and benefits, the patient was                        deemed in satisfactory condition to undergo the                        procedure.                       After obtaining informed consent, the colonoscope was                        passed under direct vision. Throughout the procedure,  the patient's blood pressure, pulse, and oxygen                        saturations were monitored continuously. The Model                        CF-[id number]) scope was introduced through the                        anus and advanced to the the cecum, identified by      appendiceal orifice and ileocecal valve. The quality of                        the bowel preparation was good. The ileocecal valve,                        appendiceal orifice, and rectum were photographed. Scope In: 10:29:21 AM Scope Out: 10:38:08 AM Scope Withdrawal Time: 0 hours 7 minutes 18 seconds  Total Procedure Duration: 0 hours 8 minutes 47 seconds  Findings:      Three sessile polyps were found in the rectum and ascending colon. The       polyps were 3 to 7 mm in size. These polyps were removed with a cold       snare. Resection and retrieval were complete.      The exam was otherwise without abnormality. Complications:        No immediate complications. Estimated Blood Loss: Estimated blood loss: none. Impression:           - Three 3 to 7 mm polyps in the rectum and in the                        ascending colon, removed with a cold snare. Resected                        and retrieved.                       - The examination was otherwise normal. Recommendation:       - Patient has a contact number available for                        emergencies. The signs and symptoms of potential                        delayed complications were discussed with the patient.                        Return to normal activities tomorrow. Written discharge                        instructions were provided to the patient.                       - Resume previous diet.                       - Continue present medications.                       - If the polyp(s) is  proven to be 'pre-cancerous' on                        pathology, you will likely need repeat colonoscopy in                        3-5 years. You will not need colon cancer screening by                        any method (including stool testing) prior to then. Procedure Code(s):    --- Professional ---                       [phone], Colonoscopy, flexible; with removal of tumor(s),                        polyp(s), or other lesion(s) by  snare technique CPT copyright 2016 American [employer]. All rights reserved. Milus Banister, MD Milus Banister, MD 07/31/2015 10:44:56 AM Number of Addenda: 0

## 2015-07-31 NOTE — Progress Notes (Signed)
Stable to RR 

## 2015-07-31 NOTE — Progress Notes (Signed)
Called to room to assist during endoscopic procedure.  Patient ID and intended procedure confirmed with present staff. Received instructions for my participation in the procedure from the performing physician.  

## 2015-07-31 NOTE — Patient Instructions (Signed)
YOU HAD AN ENDOSCOPIC PROCEDURE TODAY AT [facility]:   Refer to the procedure report that was given to you for any specific questions about what was found during the examination.  If the procedure report does not answer your questions, please call your gastroenterologist to clarify.  If you requested that your care partner not be given the details of your procedure findings, then the procedure report has been included in a sealed envelope for you to review at your convenience later.  YOU SHOULD EXPECT: Some feelings of bloating in the abdomen. Passage of more gas than usual.  Walking can help get rid of the air that was put into your GI tract during the procedure and reduce the bloating. If you had a lower endoscopy (such as a colonoscopy or flexible sigmoidoscopy) you may notice spotting of blood in your stool or on the toilet paper. If you underwent a bowel prep for your procedure, you may not have a normal bowel movement for a few days.  Please Note:  You might notice some irritation and congestion in your nose or some drainage.  This is from the oxygen used during your procedure.  There is no need for concern and it should clear up in a day or so.  SYMPTOMS TO REPORT IMMEDIATELY:   Following lower endoscopy (colonoscopy or flexible sigmoidoscopy):  Excessive amounts of blood in the stool  Significant tenderness or worsening of abdominal pains  Swelling of the abdomen that is new, acute  Fever of 100F or higher  For urgent or emergent issues, a gastroenterologist can be reached at any hour by calling [phone].   DIET: Your first meal following the procedure should be a small meal and then it is ok to progress to your normal diet. Heavy or fried foods are harder to digest and may make you feel nauseous or bloated.  Likewise, meals heavy in dairy and vegetables can increase bloating.  Drink plenty of fluids but you should avoid alcoholic beverages for 24  hours.  ACTIVITY:  You should plan to take it easy for the rest of today and you should NOT DRIVE or use heavy machinery until tomorrow (because of the sedation medicines used during the test).    FOLLOW UP: Our staff will call the number listed on your records the next business day following your procedure to check on you and address any questions or concerns that you may have regarding the information given to you following your procedure. If we do not reach you, we will leave a message.  However, if you are feeling well and you are not experiencing any problems, there is no need to return our call.  We will assume that you have returned to your regular daily activities without incident.  If any biopsies were taken you will be contacted by phone or by letter within the next 1-3 weeks.  Please call us at [phone] if you have not heard about the biopsies in 3 weeks.    SIGNATURES/CONFIDENTIALITY: You and/or your care partner have signed paperwork which will be entered into your electronic medical record.  These signatures attest to the fact that that the information above on your After Visit Summary has been reviewed and is understood.  Full responsibility of the confidentiality of this discharge information lies with you and/or your care-partner.  Polyp handout given Repeat in possible 3 yrs Await pathology results Resume medications and diet

## 2015-08-01 ENCOUNTER — Telehealth: Payer: Self-pay

## 2015-08-01 NOTE — Telephone Encounter (Signed)
  Follow up Call-  Call back number 07/31/2015  Post procedure Call Back phone  # [phone]  Permission to leave phone message Yes     Patient questions:  Do you have a fever, pain , or abdominal swelling? No. Pain Score  0 *  Have you tolerated food without any problems? Yes.    Have you been able to return to your normal activities? Yes.    Do you have any questions about your discharge instructions: Diet   No. Medications  No. Follow up visit  No.  Do you have questions or concerns about your Care? No.  Actions: * If pain score is 4 or above: No action needed, pain <4.  No problems per the pt. maw

## 2015-08-08 ENCOUNTER — Ambulatory Visit: Payer: No Typology Code available for payment source | Admitting: Family Medicine

## 2015-08-09 ENCOUNTER — Encounter: Payer: Self-pay | Admitting: Gastroenterology

## 2015-08-12 ENCOUNTER — Encounter: Payer: Self-pay | Admitting: Family Medicine

## 2015-08-12 ENCOUNTER — Ambulatory Visit (INDEPENDENT_AMBULATORY_CARE_PROVIDER_SITE_OTHER): Payer: No Typology Code available for payment source | Admitting: Family Medicine

## 2015-08-12 VITALS — BP 142/82 | HR 76 | Temp 98.6°F | Ht 66.0 in | Wt 249.0 lb

## 2015-08-12 DIAGNOSIS — I10 Essential (primary) hypertension: Secondary | ICD-10-CM | POA: Diagnosis not present

## 2015-08-12 MED ORDER — DICLOFENAC SODIUM 75 MG PO TBEC: 75.0000 mg | DELAYED_RELEASE_TABLET | Freq: Two times a day (BID) | ORAL | Status: AC

## 2015-08-12 NOTE — Progress Notes (Signed)
Pre visit review using our clinic review tool, if applicable. No additional management support is needed unless otherwise documented below in the visit note. 

## 2015-08-12 NOTE — Progress Notes (Signed)
Subjective:    Patient ID: Luis Ryan, male    DOB: 01/26/1961, 55 y.o.   MRN: [id number]  HPI Patient seen today for medication follow-up. Past medical history includes hypertension, hyperlipidemia, and osteoarthritis. Patient had an episode of possible angioedema likely related to Ace Inhibitor back in January and medication was discontinued.  Patient reports regularly checking blood pressure and obtaining systolic readings in mid [id number] -140's.  He reports no regular exercise. Denies chest pain or dyspnea.  #2 Knee pain Patient has a history of chronic knee pain due to osteoarthritis.  He is being followed at [facility] who recommended.  Patient requests a refill of Diclofenac that he has been taking for past three months.    Past Medical History  Diagnosis Date  . HYPERLIPIDEMIA 07/10/2009    hx of   . HYPERTENSION 07/10/2009  . GERD 07/10/2009  . 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 HIP PAIN, BILATERAL 09/10/2009  . [place] 07/10/2009  . NECK MASS 05/29/2010    "watching it"  . Allergy to trees   . Seasonal allergies    Past Surgical History  Procedure Laterality Date  . Knee surgery Right 2009    scope and partial replacement  . 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  He has a 15 pack-year smoking history. He has never used smokeless tobacco. He reports that he drinks alcohol. He reports that he does not use illicit drugs. family history includes COPD in his father; Cancer in his mother; Hyperlipidemia in his other; Hypertension in his mother. There is no history of Colon cancer. Allergies  Allergen Reactions  . Lisinopril Hives        Review of Systems  Constitutional:  Negative.   HENT: Negative.   Eyes: Negative.   Respiratory: Negative.   Cardiovascular: Negative.   Gastrointestinal: Negative.   Endocrine: Negative.   Genitourinary: Negative.   Musculoskeletal:       History left knee arhtroplasty. Chronic right knee pain.  Skin: Negative.   Allergic/Immunologic: Negative.   Neurological: Negative.   Hematological: Negative.   Psychiatric/Behavioral: Negative.        Objective:   Physical Exam  Constitutional: He is oriented to person, place, and time. He appears well-developed and well-nourished.  HENT:  Head: Normocephalic.  Right Ear: External ear normal.  Left Ear: External ear normal.  Mouth/Throat: Oropharynx is clear and moist.  Eyes: Conjunctivae and EOM are normal. Pupils are equal, round, and reactive to light.  Neck: Normal range of motion.  Cardiovascular: Normal rate, regular rhythm, normal heart sounds and intact distal pulses.   Pulmonary/Chest: Effort normal and breath sounds normal.  Musculoskeletal: Normal range of motion.  Neurological: He is alert and oriented to person, place, and time.  Skin: Skin is warm and dry.  Psychiatric: He has a normal mood and affect. His behavior is normal. Judgment and thought content normal.          Assessment & Plan:  1. Hypertension-Slightly elevated above goal today 140/80 and 142/82.   Plan:  Continue Amlodipine 5 mg. Recommended weight loss and participating in routine regular exercise. Recheck in 3 months.  2. Right Knee pain- Chronic knee pain related  to osteoarthritis. Patient followed by external orthopedic office  which recommended continuing Diclofenac and for medication to be managed by PCP.  Plan:  Diclofenac 75 mg twice daily as needed. Check LFT in 3 months.

## 2015-08-12 NOTE — Patient Instructions (Signed)
Try to lose some weight. Monitor blood pressure and be in touch if consistently > 150/90.

## 2015-10-22 ENCOUNTER — Other Ambulatory Visit: Payer: Self-pay | Admitting: Family Medicine

## 2015-10-23 ENCOUNTER — Other Ambulatory Visit: Payer: Self-pay | Admitting: Family Medicine

## 2015-10-23 NOTE — Telephone Encounter (Signed)
Last OV 08/12/2015 Last refill 07/10/2015 #180 Please advise on refill

## 2015-10-23 NOTE — Telephone Encounter (Signed)
Refill OK

## 2015-11-12 ENCOUNTER — Ambulatory Visit (INDEPENDENT_AMBULATORY_CARE_PROVIDER_SITE_OTHER): Payer: No Typology Code available for payment source | Admitting: Family Medicine

## 2015-11-12 ENCOUNTER — Encounter: Payer: Self-pay | Admitting: Family Medicine

## 2015-11-12 VITALS — BP 120/82 | HR 74 | Temp 98.2°F | Wt 250.0 lb

## 2015-11-12 DIAGNOSIS — Z79899 Other long term (current) drug therapy: Secondary | ICD-10-CM

## 2015-11-12 DIAGNOSIS — I10 Essential (primary) hypertension: Secondary | ICD-10-CM

## 2015-11-12 MED ORDER — AMLODIPINE BESYLATE 5 MG PO TABS: 5.0000 mg | ORAL_TABLET | Freq: Every day | ORAL | Status: DC

## 2015-11-12 NOTE — Progress Notes (Signed)
   Subjective:    Patient ID: Luis Ryan, male    DOB: Jul 31, 1960, 55 y.o.   MRN: [id number]  HPI Medical follow-up. Patient is getting ready to move to [place] for his job. He'll be moving in early July.  Hypertension which has been controlled with amlodipine. No side effects. Compliant with therapy. No headaches or dizziness. No chest pains.  Osteoarthritis-especially knees. He takes total of 2 tramadol per day and also Voltaren. No recent hepatic panel. Denies any GI symptoms.  Past Medical History  Diagnosis Date  . HYPERLIPIDEMIA 07/10/2009    hx of   . HYPERTENSION 07/10/2009  . GERD 07/10/2009  . 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 HIP PAIN, BILATERAL 09/10/2009  . [place] 07/10/2009  . NECK MASS 05/29/2010    "watching it"  . Allergy to trees   . Seasonal allergies    Past Surgical History  Procedure Laterality Date  . Knee surgery Right 2009    scope and partial replacement  . 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  He has a 15 pack-year smoking history. He has never used smokeless tobacco. He reports that he drinks alcohol. He reports that he does not use illicit drugs. family history includes COPD in his father; Cancer in his mother; Hyperlipidemia in his other; Hypertension in his mother. There is no history of Colon cancer. Allergies  Allergen Reactions  . Lisinopril Hives      Review of Systems  Constitutional: Negative for fatigue.  Eyes: Negative for visual disturbance.  Respiratory: Negative for cough, chest tightness and shortness of breath.   Cardiovascular: Negative for chest pain, palpitations and leg swelling.  Musculoskeletal: Positive for arthralgias.    Neurological: Negative for dizziness, syncope, weakness, light-headedness and headaches.       Objective:   Physical Exam  Constitutional: He is oriented to person, place, and time. He appears well-developed and well-nourished.  HENT:  Right Ear: External ear normal.  Left Ear: External ear normal.  Mouth/Throat: Oropharynx is clear and moist.  Eyes: Pupils are equal, round, and reactive to light.  Neck: Neck supple. No thyromegaly present.  Cardiovascular: Normal rate and regular rhythm.   Pulmonary/Chest: Effort normal and breath sounds normal. No respiratory distress. He has no wheezes. He has no rales.  Musculoskeletal: He exhibits no edema.  Neurological: He is alert and oriented to person, place, and time.          Assessment & Plan:  #1 hypertension stable at goal. Continue amlodipine  #2 osteoarthritis involving mostly knees. Continue as needed tramadol. Check hepatic panel with chronic Voltaren use. Watch closely for GI side effects.  Eulas Post MD [facility]

## 2015-11-12 NOTE — Progress Notes (Signed)
Pre visit review using our clinic review tool, if applicable. No additional management support is needed unless otherwise documented below in the visit note. 

## 2016-08-12 ENCOUNTER — Other Ambulatory Visit: Payer: Self-pay | Admitting: *Deleted

## 2016-08-12 MED ORDER — AMLODIPINE BESYLATE 5 MG PO TABS: 5.0000 mg | ORAL_TABLET | Freq: Every day | ORAL | 0 refills | Status: AC

## 2017-02-11 ENCOUNTER — Encounter: Payer: Self-pay | Admitting: Family Medicine

## 2018-08-11 ENCOUNTER — Encounter: Payer: Self-pay | Admitting: Gastroenterology

## 2018-10-24 DEATH — deceased
